# Patient Record
Sex: Female | Born: 1990 | Race: Black or African American | Hispanic: No | Marital: Single | Smoking: Never smoker
Health system: Southern US, Community
[De-identification: ages and names within clinical notes are randomized; demographics above are authoritative.]

## PROBLEM LIST (undated history)

## (undated) DIAGNOSIS — Z789 Other specified health status: Secondary | ICD-10-CM

## (undated) HISTORY — PX: NO PAST SURGERIES: SHX2092

---
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
## 2008-01-26 ENCOUNTER — Encounter: Payer: Self-pay | Admitting: Family Medicine

## 2008-02-02 ENCOUNTER — Ambulatory Visit: Payer: Self-pay | Admitting: Family Medicine

## 2008-02-02 ENCOUNTER — Encounter: Payer: Self-pay | Admitting: Family Medicine

## 2008-02-05 ENCOUNTER — Ambulatory Visit: Payer: Self-pay | Admitting: Family Medicine

## 2008-02-05 LAB — CONVERTED CEMR LAB
Chlamydia, DNA Probe: POSITIVE — AB
GC Probe Amp, Genital: NEGATIVE

## 2008-02-06 ENCOUNTER — Encounter: Payer: Self-pay | Admitting: Family Medicine

## 2008-05-02 ENCOUNTER — Emergency Department (HOSPITAL_COMMUNITY): Admission: EM | Admit: 2008-05-02 | Discharge: 2008-05-02 | Payer: Self-pay | Admitting: Emergency Medicine

## 2008-06-21 ENCOUNTER — Telehealth: Payer: Self-pay | Admitting: *Deleted

## 2008-06-21 ENCOUNTER — Emergency Department (HOSPITAL_COMMUNITY): Admission: EM | Admit: 2008-06-21 | Discharge: 2008-06-21 | Payer: Self-pay | Admitting: Emergency Medicine

## 2008-08-05 ENCOUNTER — Ambulatory Visit: Payer: Self-pay | Admitting: Family Medicine

## 2008-08-05 DIAGNOSIS — N912 Amenorrhea, unspecified: Secondary | ICD-10-CM | POA: Insufficient documentation

## 2008-08-12 ENCOUNTER — Telehealth: Payer: Self-pay | Admitting: *Deleted

## 2008-08-13 ENCOUNTER — Encounter (INDEPENDENT_AMBULATORY_CARE_PROVIDER_SITE_OTHER): Payer: Self-pay | Admitting: Family Medicine

## 2008-08-13 ENCOUNTER — Ambulatory Visit: Payer: Self-pay | Admitting: Family Medicine

## 2008-08-13 LAB — CONVERTED CEMR LAB
Blood in Urine, dipstick: NEGATIVE
Nitrite: NEGATIVE
Protein, U semiquant: 30

## 2008-08-14 ENCOUNTER — Encounter: Payer: Self-pay | Admitting: Family Medicine

## 2008-08-16 ENCOUNTER — Encounter: Payer: Self-pay | Admitting: Family Medicine

## 2008-08-17 LAB — CONVERTED CEMR LAB: Chlamydia, DNA Probe: POSITIVE — AB

## 2008-08-19 ENCOUNTER — Ambulatory Visit: Payer: Self-pay | Admitting: Family Medicine

## 2008-08-19 DIAGNOSIS — A5609 Other chlamydial infection of lower genitourinary tract: Secondary | ICD-10-CM

## 2008-08-19 LAB — CONVERTED CEMR LAB: Beta hcg, urine, semiquantitative: NEGATIVE

## 2008-12-15 ENCOUNTER — Encounter: Payer: Self-pay | Admitting: Family Medicine

## 2008-12-15 ENCOUNTER — Ambulatory Visit: Payer: Self-pay | Admitting: Family Medicine

## 2008-12-15 LAB — CONVERTED CEMR LAB: Whiff Test: NEGATIVE

## 2008-12-17 LAB — CONVERTED CEMR LAB
Chlamydia, DNA Probe: NEGATIVE
GC Probe Amp, Genital: NEGATIVE

## 2009-03-03 ENCOUNTER — Ambulatory Visit: Payer: Self-pay | Admitting: Family Medicine

## 2009-03-03 DIAGNOSIS — N644 Mastodynia: Secondary | ICD-10-CM

## 2009-04-13 ENCOUNTER — Telehealth: Payer: Self-pay | Admitting: Family Medicine

## 2009-04-14 ENCOUNTER — Ambulatory Visit: Payer: Self-pay | Admitting: Family Medicine

## 2009-04-14 DIAGNOSIS — L240 Irritant contact dermatitis due to detergents: Secondary | ICD-10-CM | POA: Insufficient documentation

## 2009-05-22 ENCOUNTER — Emergency Department (HOSPITAL_COMMUNITY): Admission: EM | Admit: 2009-05-22 | Discharge: 2009-05-22 | Payer: Self-pay | Admitting: Family Medicine

## 2009-12-16 ENCOUNTER — Ambulatory Visit: Payer: Self-pay | Admitting: Family Medicine

## 2009-12-16 ENCOUNTER — Encounter: Payer: Self-pay | Admitting: Family Medicine

## 2009-12-16 DIAGNOSIS — N760 Acute vaginitis: Secondary | ICD-10-CM | POA: Insufficient documentation

## 2009-12-16 LAB — CONVERTED CEMR LAB: Whiff Test: POSITIVE

## 2010-01-02 ENCOUNTER — Encounter: Payer: Self-pay | Admitting: *Deleted

## 2010-01-02 ENCOUNTER — Ambulatory Visit: Payer: Self-pay | Admitting: Family Medicine

## 2010-01-02 ENCOUNTER — Encounter: Payer: Self-pay | Admitting: Sports Medicine

## 2010-01-02 ENCOUNTER — Encounter: Payer: Self-pay | Admitting: Family Medicine

## 2010-03-01 ENCOUNTER — Ambulatory Visit: Admit: 2010-03-01 | Payer: Self-pay

## 2010-03-10 ENCOUNTER — Ambulatory Visit
Admission: RE | Admit: 2010-03-10 | Discharge: 2010-03-10 | Payer: Self-pay | Source: Home / Self Care | Attending: Family Medicine | Admitting: Family Medicine

## 2010-03-10 ENCOUNTER — Other Ambulatory Visit
Admission: RE | Admit: 2010-03-10 | Discharge: 2010-03-10 | Payer: Self-pay | Source: Home / Self Care | Admitting: Family Medicine

## 2010-03-10 ENCOUNTER — Other Ambulatory Visit: Payer: Self-pay | Admitting: Family Medicine

## 2010-03-10 ENCOUNTER — Encounter: Payer: Self-pay | Admitting: Family Medicine

## 2010-03-13 LAB — CONVERTED CEMR LAB
Chlamydia, DNA Probe: POSITIVE — AB
GC Probe Amp, Genital: POSITIVE — AB

## 2010-03-14 ENCOUNTER — Encounter: Payer: Self-pay | Admitting: Family Medicine

## 2010-03-14 ENCOUNTER — Ambulatory Visit
Admission: RE | Admit: 2010-03-14 | Discharge: 2010-03-14 | Payer: Self-pay | Source: Home / Self Care | Attending: Family Medicine | Admitting: Family Medicine

## 2010-03-14 DIAGNOSIS — A5400 Gonococcal infection of lower genitourinary tract, unspecified: Secondary | ICD-10-CM | POA: Insufficient documentation

## 2010-03-14 DIAGNOSIS — A5601 Chlamydial cystitis and urethritis: Secondary | ICD-10-CM | POA: Insufficient documentation

## 2010-03-14 LAB — CONVERTED CEMR LAB: HIV: NONREACTIVE

## 2010-03-28 NOTE — Assessment & Plan Note (Signed)
Summary: rash/[place]/chambliss   Vital Signs:  Patient profile:   20 year old female Height:      59 inches Weight:      163 pounds BMI:     33.04 Temp:     98.4 degrees F oral Pulse rate:   83 / minute BP sitting:   112 / 74  (right arm) Cuff size:   regular  Vitals Entered By: Garen Grams LPN (April 14, 2009 8:43 AM) CC: rash on arms and back Is Patient Diabetic? No Pain Assessment Patient in pain? no        Primary Care Provider:  Pearlean Brownie MD  CC:  rash on arms and back.  History of Present Illness: 20 yo here for 1-2 day history of rash on arms and back.  Notes itchiness with a lot of scratching in upper back and bilateral arms.  No other hosuehold contacts have similar.  She says she recently changed soaps to [place] right before this.  No history of eczema or allergies.  Not takign any medications.  itching keeping her up at night.  Habits & Providers  Alcohol-Tobacco-Diet     Tobacco Status: never  Allergies: No Known Drug Allergies PMH-FH-SH reviewed-no changes except otherwise noted  Review of Systems      See HPI General:  Denies fever. Derm:  Complains of changes in color of skin, itching, and rash; denies changes in nail beds, dryness, and insect bite(s).  Physical Exam  General:  Well-developed,well-nourished,in no acute distress; alert,appropriate and cooperative throughout examination Skin:  no rash noted on upper back.  3 scattered erythematous papules on left forearm, 4 scattered papules under right forearm.  No lesions noted on fingers, abd, ankles, legs.   Impression & Recommendations:  Problem # 1:  CONTACT DERMATITIS&OTHER ECZEMA DUE DETERGENTS (ICD-692.0)  Likely irritant dermatitis due to new detergent in setting of winter itch.  Will prescribed non-sedatign antihistamine and triamcinolone for lesions.  Given list of gentle soaps to change to, moisturization, and to consider changing to non-scented detergent and washing sheets.   Discussed possibility of insect bites but does not look like scabies or fleas.  Patient to return if no improvement or new lesions.  Her updated medication list for this problem includes:    Cetirizine Hcl 10 Mg Tabs (Cetirizine hcl) .Marland Kitchen... Take one tablet daily for itching    Triamcinolone Acetonide 0.1 % Crea (Triamcinolone acetonide) .Marland Kitchen... Apply to affected areas twice daily. dispense 80 gm tube  Orders: [facility]- Est Level  3 (13086)  Complete Medication List: 1)  Sprintec 28 0.25-35 Mg-mcg Tabs (Norgestimate-eth estradiol) .Marland Kitchen.. 1 daily same time of day 2)  Cetirizine Hcl 10 Mg Tabs (Cetirizine hcl) .... Take one tablet daily for itching 3)  Triamcinolone Acetonide 0.1 % Crea (Triamcinolone acetonide) .... Apply to affected areas twice daily. dispense 80 gm tube  Patient Instructions: 1)  Try gentle soaps like Dove, Aveena, Purpose, Cetaphil.  Avoid heaviuly scented soaps. 2)  Moisturize after showers and twice daily.  Some good moisturizer are Aveeno, Eucerin. 3)  Apply steroid cream on body only as long as needed.   4)  Take antihistamine for itching. 5)  If you notice new bumps in areas where you dont scratch or others in your family are getting these, or you don't get better, Please return. Prescriptions: TRIAMCINOLONE ACETONIDE 0.1 % CREA (TRIAMCINOLONE ACETONIDE) apply to affected areas twice daily. Dispense 80 gm tube  #1 x 0   Entered and Authorized by:  Delbert Harness MD   Signed by:   Delbert Harness MD on 04/14/2009   Method used:   Electronically to        [facility] [phone]* (retail)       [address]       [place], [place]  [address]       Ph: [phone]       Fax: [phone]   RxID:   [id number] CETIRIZINE HCL 10 MG TABS (CETIRIZINE HCL) take one tablet daily for itching  #30 x 1   Entered and Authorized by:   Delbert Harness MD   Signed by:   Delbert Harness MD on 04/14/2009   Method used:   Electronically to        [facility] [phone]* (retail)        [address]       [place], [place]  [address]       Ph: [phone]       Fax: [phone]   RxID:   [id number]

## 2010-03-28 NOTE — Letter (Signed)
Summary: Handout Printed  Printed Handout:  - Abscess-Boil, Care After Surgery

## 2010-03-28 NOTE — Progress Notes (Signed)
Summary: triage  Phone Note Call from Patient Call back at [phone]   Caller: Patient Summary of Call: Pt has little bumps on her arms and back and they itch.  Wondering if she can be worked in first thing in the morning. Initial call taken by: Clydell Hakim,  April 13, 2009 2:01 PM  Follow-up for Phone Call        started yesterday. her sister told her it may be bedbugs. it is a fine rash all over. told her what bedbugs look like & how to look for them. unable to come iin today. appt at 8:30am Thursday Follow-up by: Golden Circle RN,  April 13, 2009 2:20 PM

## 2010-03-28 NOTE — Miscellaneous (Signed)
Summary: Procedure Consent  Procedure Consent   Imported By: De Nurse 01/05/2010 11:49:39  _____________________________________________________________________  External Attachment:    Type:   Image     Comment:   External Document

## 2010-03-28 NOTE — Assessment & Plan Note (Signed)
Summary: breast pain,df   Vital Signs:  Patient profile:   20 year old female Height:      59 inches Weight:      159 pounds BMI:     32.23 BSA:     1.67 Temp:     98.5 degrees F Pulse rate:   91 / minute BP sitting:   139 / 82  Vitals Entered By: Jone Baseman CMA (March 03, 2009 8:39 AM) CC: bilateral breast pain Is Patient Diabetic? No Pain Assessment Patient in pain? yes     Location: breast   Primary Care Provider:  Pearlean Brownie MD  CC:  bilateral breast pain.  History of Present Illness: Breast tenderness for last 1.5 months associated with nausea and mild headaches.  Pain is diffuse in both breasts with focal tenderness or redness or discharge.  LMP was light in December.  Has not had regular menstrual periods since started depo.  Is sex active does not desire pregnancy.  No vaginal symptoms  ROS - as above PMH - Medications reviewed and updated in medication list.  Smoking Status noted in VS form    Habits & Providers  Alcohol-Tobacco-Diet     Tobacco Status: never  Allergies: No Known Drug Allergies  Physical Exam  Breasts:  No mass, nodules, thickening, s, bulging, retraction, inflamation, nipple discharge or skin changes noted.  is mildly diffusely tender  Abdomen:  Bowel sounds positive,abdomen soft and non-tender without masses, organomegaly or hernias noted.   Impression & Recommendations:  Problem # 1:  BREAST PAIN (ICD-611.71)  Upreg is negative so unlikely due to pregnancy but will recheck in 1 week.  Likely due to hormone fluctuations as comes off of depo.   Recommend analgesics and will start bcp if next upreg is normal   Orders: [facility]- Est Level  3 ([address])  Problem # 2:  CONTRACEPTIVE MANAGEMENT (ICD-V25.09) start bcp as above.  Has taken before without problems  Complete Medication List: 1)  Sprintec 28 0.25-35 Mg-mcg Tabs (Norgestimate-eth estradiol) .Marland Kitchen.. 1 daily same time of day  Other Orders: U Preg-[facility]  (60454)  Patient Instructions: 1)  come back in 1 week for a pregnancy test - Nurse Visit 2)  If the test is negative start the birth control pills that day - one each day 3)  Come back in 1 month for a Pap Smear 4)  Use Tylenol or ibuprofen for pain Prescriptions: SPRINTEC 28 0.25-35 MG-MCG TABS (NORGESTIMATE-ETH ESTRADIOL) 1 daily same time of day  #1 x 11   Entered and Authorized by:   Pearlean Brownie MD   Signed by:   Pearlean Brownie MD on 03/03/2009   Method used:   Electronically to        [facility] [phone]* (retail)       [address]       [place], [place]  [address]       Ph: [phone]       Fax: [phone]   RxID:   [id number]   Laboratory Results   Urine Tests  Date/Time Received: March 03, 2009 8:43 AM  Date/Time Reported: March 03, 2009 8:49 AM     Urine HCG: negative Comments: ............................................... Delora Fuel March 03, 2009 8:49 AM      Prevention & Chronic Care Immunizations   Influenza vaccine: Not documented    Tetanus booster: 10/01/2006: Tdap [employer])   Tetanus booster due: 09/30/2016    Pneumococcal vaccine: Not documented  Other Screening  Pap smear: LOW GRADE SQUAMOUS INTRAEPITHELIAL LESION: CIN-1/ VAIN-1/  (02/02/2008)   Pap smear due: 02/01/2009   Smoking status: never  (03/03/2009)

## 2010-03-28 NOTE — Assessment & Plan Note (Signed)
Summary: knot under arm,df   Vital Signs:  Patient profile:   20 year old female Weight:      152 pounds Temp:     98.7 degrees F oral Pulse rate:   106 / minute Pulse rhythm:   regular BP sitting:   137 / 83  (left arm) Cuff size:   regular  Vitals Entered By: Loralee Pacas CMA (January 02, 2010 11:26 AM) CC: knot under right arm Comments knot under right arm x 3 days really painful   Primary Care Provider:  Pearlean Brownie MD  CC:  knot under right arm.  History of Present Illness: 20 yo female here with a few days of painful swelling under R axilla.  Swelling:  No drainage however there is a head to the swelling per pt.  Worsening.  No fevers/chills.  o numbness/tingling in arm or hand.  Hurts to move arm.  Hasn't tried any medicine for this.  This has never happened before.    Habits & Providers  Alcohol-Tobacco-Diet     Tobacco Status: never  Exercise-Depression-Behavior     Have you felt down or hopeless? no     Have you felt little pleasure in things? no     Depression Counseling: not indicated; screening negative for depression     Seat Belt Use: always  Current Medications (verified): 1)  Sprintec 28 0.25-35 Mg-Mcg Tabs (Norgestimate-Eth Estradiol) .Marland Kitchen.. 1 Daily Same Time of Day 2)  Cetirizine Hcl 10 Mg Tabs (Cetirizine Hcl) .... Take One Tablet Daily For Itching 3)  Triamcinolone Acetonide 0.1 % Crea (Triamcinolone Acetonide) .... Apply To Affected Areas Twice Daily. Dispense 80 Gm Tube 4)  Naproxen Sodium 550 Mg Tabs (Naproxen Sodium) .... One Tab Po Two Times A Day As Needed For Pain.  Allergies (verified): No Known Drug Allergies  Social History: Risk analyst Use:  always  Review of Systems       See HPI  Physical Exam  General:  Well-developed,well-nourished,in no acute distress; alert,appropriate and cooperative throughout examination Extremities:  2cm boil without drainage and without surrounding induration.  Fluctuant, warm, very  tender. Additional Exam:  Procedure: I&D Consent obtained and verified. Sterile betadine prep. Furthur cleansed with alcohol. 6 cc of 2% lidocaine with epi infiltrated around and under abscess. Draped in a sterile fashion. Confirmed local anesthesia, then 2cm incision made with #11 blade Copious amounts of foul smelling pus burst out. 4 quadrants explored with curved hemostat and loculations broken up. Approx 8-9 inches of 1/4 inch iodoform placed in with 2cm tail sticking out. Completed without difficulty Aftercare instructions and Red flags advised.      Impression & Recommendations:  Problem # 1:  ABSCESS, AXILLA, RIGHT (ICD-682.3) Assessment New I&D'ed. Naproxen 550 two times a day for pain. Pt to remove half the packing tomorrow and the rest 1-2d after that. RTC as needed.  Handout given.  Orders: [facility]- Est Level  3 ([address]) I&D Abcess, simple- [facility] (10060)  Complete Medication List: 1)  Sprintec 28 0.25-35 Mg-mcg Tabs (Norgestimate-eth estradiol) .Marland Kitchen.. 1 daily same time of day 2)  Cetirizine Hcl 10 Mg Tabs (Cetirizine hcl) .... Take one tablet daily for itching 3)  Triamcinolone Acetonide 0.1 % Crea (Triamcinolone acetonide) .... Apply to affected areas twice daily. dispense 80 gm tube 4)  Naproxen Sodium 550 Mg Tabs (Naproxen sodium) .... One tab po two times a day as needed for pain. Prescriptions: NAPROXEN SODIUM 550 MG TABS (NAPROXEN SODIUM) One tab Po two times a day  as needed for pain.  #30 x 0   Entered and Authorized by:   Rodney Langton MD   Signed by:   Rodney Langton MD on 01/02/2010   Method used:   Print then Give to Patient   RxID:   [id number]    Orders Added: 1)  [facility]- Est Level  3 [14782] 2)  I&D Abcess, simple- [facility] [10060]

## 2010-03-28 NOTE — Assessment & Plan Note (Signed)
Summary: BV infection   Vital Signs:  Patient profile:   20 year old female Weight:      154 pounds Temp:     98.4 degrees F oral Pulse rate:   98 / minute Pulse rhythm:   regular BP sitting:   113 / 72  (right arm) Cuff size:   regular  Vitals Entered By: Loralee Pacas CMA (December 16, 2009 10:18 AM) CC: ? yeast infection   Primary Care Kasyn Rolph:  Pearlean Brownie MD  CC:  ? yeast infection.  History of Present Illness: Vaginal Discharge: Pt comes in with vaginal discharge. SHe has not had any new sexual partners, but she does have some itching. No increased frequency, no burning, no new odor. She has had lots os yeast infections in the past and she wants to have some anti-yeast meds.   Habits & Providers  Alcohol-Tobacco-Diet     Tobacco Status: never  Current Medications (verified): 1)  Sprintec 28 0.25-35 Mg-Mcg Tabs (Norgestimate-Eth Estradiol) .Marland Kitchen.. 1 Daily Same Time of Day 2)  Cetirizine Hcl 10 Mg Tabs (Cetirizine Hcl) .... Take One Tablet Daily For Itching 3)  Triamcinolone Acetonide 0.1 % Crea (Triamcinolone Acetonide) .... Apply To Affected Areas Twice Daily. Dispense 80 Gm Tube 4)  Metronidazole 500 Mg Tabs (Metronidazole) .... Take 1 By Mouth Two Times A Day X 7 Days  Allergies (verified): No Known Drug Allergies  Review of Systems       see HPI  Physical Exam  General:  Well-developed,well-nourished,in no acute distress; alert,appropriate and cooperative throughout examination Genitalia:  Normal introitus for age, no external lesions, + white vaginal discharge, mucosa pink and moist, no vaginal or cervical lesions, no vaginal atrophy, no friaility or hemorrhage, normal uterus size and position, no adnexal masses or tenderness   Impression & Recommendations:  Problem # 1:  BACTERIAL VAGINITIS (ICD-616.10) Assessment New Pt had wet prep and bimanual exam in the clinic. She does not have any motion tenderness. She was found to have BV. Will treat ast  below.   Her updated medication list for this problem includes:    Metronidazole 500 Mg Tabs (Metronidazole) .Marland Kitchen... Take 1 by mouth two times a day x 7 days  Orders: [facility]- Est Level  3 ([address])  Complete Medication List: 1)  Sprintec 28 0.25-35 Mg-mcg Tabs (Norgestimate-eth estradiol) .Marland Kitchen.. 1 daily same time of day 2)  Cetirizine Hcl 10 Mg Tabs (Cetirizine hcl) .... Take one tablet daily for itching 3)  Triamcinolone Acetonide 0.1 % Crea (Triamcinolone acetonide) .... Apply to affected areas twice daily. dispense 80 gm tube 4)  Metronidazole 500 Mg Tabs (Metronidazole) .... Take 1 by mouth two times a day x 7 days  Other Orders: Wet Prep- [facility] [id number]) GC/Chlamydia-[facility] (87591/87491)  Patient Instructions: 1)  You have bacterial vaginosis. No yeast was seen.  2)  Take the medicine as prescribed.  Prescriptions: METRONIDAZOLE 500 MG TABS (METRONIDAZOLE) take 1 by mouth two times a day x 7 days  #14 x 0   Entered and Authorized by:   Jamie Brookes MD   Signed by:   Jamie Brookes MD on 12/16/2009   Method used:   Electronically to        [facility] (retail)       [address]       [place], [place]  [id number]       Ph: [phone]       Fax: [phone]   RxID:   [id number]  Orders Added: 1)  Wet Prep- [facility] [87210] 2)  GC/Chlamydia-[facility] [87591/87491] 3)  [facility]- Est Level  3 [[address]]    Laboratory Results  Date/Time Received: December 16, 2009 11:17 AM  Date/Time Reported: December 16, 2009 11:29 AM   Wet [place] Source: vag WBC/hpf: 5-15 Bacteria/hpf: 3+  Cocci Clue cells/hpf: many  Positive whiff Yeast/hpf: none Trichomonas/hpf: none Comments: ...............test performed by......Marland KitchenBonnie A. [place]az[place], MLS (ASCP)cm

## 2010-03-28 NOTE — Letter (Signed)
Summary: Out of Work  [facility]  [address]   [place], [place] [address]   Phone: [phone]  Fax: [phone]    January 02, 2010   Employee:  Dondra Prader TAHISHA HAKIM    To Whom It May Concern:   For Medical reasons, please excuse the above named employee from work for the following dates:  Start:   January 02, 2010  End:  January 03, 2010  If you need additional information, please feel free to contact our office.         Sincerely,    Loralee Pacas CMA

## 2010-03-30 ENCOUNTER — Encounter: Payer: Self-pay | Admitting: *Deleted

## 2010-03-30 NOTE — Assessment & Plan Note (Signed)
Summary: std tx,df  Nurse Visit   Allergies: No Known Drug Allergies  Medication Administration  Injection # 1:    Medication: Rocephin  250mg     Diagnosis: GONORRHEA (ICD-098.0)    Route: IM    Site: RUOQ gluteus    Exp Date: 09/26/2012    Lot #: [id number]    Mfr: Hospira    Comments: patient waited in office 20 minutes after injection without any problems    Patient tolerated injection without complications    Given by: Theresia Lo RN (March 14, 2010 10:23 AM)  Medication # 1:    Medication: Azithromycin oral    Diagnosis: CHLAMYDIAL INFECTION (ICD-099.41)    Dose:  1 gram    Route: po    Exp Date: 05/28/2011    Lot #: [id number]    Mfr: Pfizer    Patient tolerated medication without complications    Given by: Theresia Lo RN (March 14, 2010 10:24 AM)  Orders Added: 1)  Rocephin  250mg  [J0696] 2)  Azithromycin oral [Q0144] 3)  Admin of Injection (IM/SQ) [11914]   Medication Administration  Injection # 1:    Medication: Rocephin  250mg     Diagnosis: GONORRHEA (ICD-098.0)    Route: IM    Site: RUOQ gluteus    Exp Date: 09/26/2012    Lot #: [id number]    Mfr: Hospira    Comments: patient waited in office 20 minutes after injection without any problems    Patient tolerated injection without complications    Given by: Theresia Lo RN (March 14, 2010 10:23 AM)  Medication # 1:    Medication: Azithromycin oral    Diagnosis: CHLAMYDIAL INFECTION (ICD-099.41)    Dose:  1 gram    Route: po    Exp Date: 05/28/2011    Lot #: [id number]    Mfr: Pfizer    Patient tolerated medication without complications    Given by: Theresia Lo RN (March 14, 2010 10:24 AM)  Orders Added: 1)  Rocephin  250mg  [J0696] 2)  Azithromycin oral [Q0144] 3)  Admin of Injection (IM/SQ) [78469]    Appended Document: std tx,df Communicable Disease report faxed to [facility]  Department.

## 2010-03-30 NOTE — Letter (Signed)
Summary: Results Follow-up Letter  [facility]  [address]   [place], [place] [address]   Phone: [phone]  Fax: [phone]    03/14/2010  [address] [place], [place]  [address]  Dear Ms. Soffer,   The following are the results of your recent test(s):  Your tests for HIV and syphillis are negative.  Sincerely,  Delbert Harness MD [facility]           Appended Document: Results Follow-up Letter mailed

## 2010-03-30 NOTE — Assessment & Plan Note (Signed)
Summary: vag d/c/bmc   ****NEXT DEPO DUE 3/31-4/14****  Vital Signs:  Patient profile:   20 year old female Height:      59 inches Weight:      147 pounds Temp:     97.9 degrees F oral Pulse rate:   72 / minute Pulse rhythm:   regular BP sitting:   120 / 73  (left arm) Cuff size:   regular  Vitals Entered By: Loralee Pacas CMA (March 10, 2010 3:35 PM) CC: vaginal d/c Is Patient Diabetic? No Pain Assessment Patient in pain? no        Primary Care Provider:  Pearlean Brownie MD  CC:  vaginal d/c.  History of Present Illness: 20 yo here for evaluation of vaginal discharge  VAGINAL DISCHARGE Onset: days Description:  white with odor Modifying factors: none  Symptoms Odor: yes Itching: no Vaginal burning: no  Dysuria: no Bleeding: no Pelvic pain: no Fever: no Genital sores: no Dyspareunia: no   Red Flags:  Missed period: no Possible STD exposure: uses condoms       Habits & Providers  Alcohol-Tobacco-Diet     Tobacco Status: never  Exercise-Depression-Behavior     Have you felt down or hopeless? no     Have you felt little pleasure in things? no     Depression Counseling: not indicated; screening negative for depression     Seat Belt Use: always  Current Medications (verified): 1)  Sprintec 28 0.25-35 Mg-Mcg Tabs (Norgestimate-Eth Estradiol) .Marland Kitchen.. 1 Daily Same Time of Day 2)  Cetirizine Hcl 10 Mg Tabs (Cetirizine Hcl) .... Take One Tablet Daily For Itching 3)  Triamcinolone Acetonide 0.1 % Crea (Triamcinolone Acetonide) .... Apply To Affected Areas Twice Daily. Dispense 80 Gm Tube 4)  Naproxen Sodium 550 Mg Tabs (Naproxen Sodium) .... One Tab Po Two Times A Day As Needed For Pain. 5)  Metrogel-Vaginal 0.75 % Gel (Metronidazole) .... Apply 5 Gm Vaginallynightly X 5 Days 6)  Metronidazole 500 Mg Tabs (Metronidazole) .... One Tablet Twice A Day For 7 Days  Allergies: No Known Drug Allergies PMH-FH-SH reviewed for relevance  Review of  Systems      See HPI  Physical Exam  General:  Well-developed,well-nourished,in no acute distress; alert,appropriate and cooperative throughout examination Genitalia:  Pelvic Exam:        External: normal female genitalia without lesions or masses        Vagina: normal without lesions or masses        Cervix: normal without lesions or masses        Adnexa: normal bimanual exam without masses or fullness        Uterus: normal by palpation        Pap smear: performed   Impression & Recommendations:  Problem # 1:  BACTERIAL VAGINITIS (ICD-616.10) Will terat BV, gonorrhea, chlamydia pending.  Patient would prefer metrogel but unsure if she can afford it as she is self-pay.  I gave her prescriptions for both metrogel and metronidazole tablets and she will fill one of those.  Her updated medication list for this problem includes:    Metrogel-vaginal 0.75 % Gel (Metronidazole) .Marland Kitchen... Apply 5 gm vaginallynightly x 5 days    Metronidazole 500 Mg Tabs (Metronidazole) ..... One tablet twice a day for 7 days  Orders: GC/Chlamydia-[facility] (87591/87491) Wet Prep- [facility] (98119) [facility]- Est Level  3 (14782)  Problem # 2:  CONTRACEPTIVE MANAGEMENT (ICD-V25.09) discussed contraception. She would like depo.  pap performed today.  Orders: U Preg-[facility] (  13086) Depo-Provera 150mg  (J1055) [facility]- Est Level  3 (57846)  Complete Medication List: 1)  Cetirizine Hcl 10 Mg Tabs (Cetirizine hcl) .... Take one tablet daily for itching 2)  Triamcinolone Acetonide 0.1 % Crea (Triamcinolone acetonide) .... Apply to affected areas twice daily. dispense 80 gm tube 3)  Naproxen Sodium 550 Mg Tabs (Naproxen sodium) .... One tab po two times a day as needed for pain. 4)  Metrogel-vaginal 0.75 % Gel (Metronidazole) .... Apply 5 gm vaginallynightly x 5 days 5)  Metronidazole 500 Mg Tabs (Metronidazole) .... One tablet twice a day for 7 days 6)  Depo-provera 150 Mg/ml Susp (Medroxyprogesterone acetate) .... Once every 12  weeks  Other Orders: Pap Smear-[facility] ([id number]) Prescriptions: METRONIDAZOLE 500 MG TABS (METRONIDAZOLE) one tablet twice a day for 7 days  #14 x 0   Entered and Authorized by:   Delbert Harness MD   Signed by:   Delbert Harness MD on 03/10/2010   Method used:   Print then Give to Patient   RxID:   [id number] METROGEL-VAGINAL 0.75 % GEL (METRONIDAZOLE) Apply 5 gm vaginallynightly x 5 days  #1 x 0   Entered and Authorized by:   Delbert Harness MD   Signed by:   Delbert Harness MD on 03/10/2010   Method used:   Print then Give to Patient   RxID:   [id number]    Medication Administration  Injection # 1:    Medication: Depo-Provera 150mg     Diagnosis: CONTRACEPTIVE MANAGEMENT (ICD-V25.09)    Route: IM    Site: R deltoid    Exp Date: 06/2012    Lot #: [id number]    Mfr: GREENSTONE    Patient tolerated injection without complications    Given by: Jimmy Footman, CMA (March 10, 2010 5:50 PM)  Orders Added: 1)  Pap Smear-[facility] [[id number]] 2)  U Preg-[facility] [81025] 3)  GC/Chlamydia-[facility] [87591/87491] 4)  Wet Prep- [facility] [87210] 5)  Depo-Provera 150mg  [J1055] 6)  [facility]- Est Level  3 [99213]    Laboratory Results   Urine Tests  Date/Time Received: March 10, 2010 4:03 PM  Date/Time Reported: March 10, 2010 4:16 PM     Urine HCG: negative Comments: ...............test performed by......Marland KitchenBonnie A. [place], MLS (ASCP)cm  Date/Time Received: March 10, 2010 3:42 PM  Date/Time Reported: March 10, 2010 4:21 PM   [employer] Source: vag WBC/hpf: 1-5 Bacteria/hpf: 3+  Cocci Clue cells/hpf: many  Positive whiff Yeast/hpf: none Trichomonas/hpf: none Comments: ...............test performed by......Marland KitchenBonnie A. [place], MLS (ASCP)cm

## 2010-05-21 LAB — POCT URINALYSIS DIP (DEVICE)
Glucose, UA: NEGATIVE mg/dL
Nitrite: NEGATIVE
Urobilinogen, UA: >=8 mg/dL (ref 0.0–1.0)

## 2010-05-21 LAB — WET PREP, GENITAL: Yeast Wet Prep HPF POC: NONE SEEN

## 2010-05-21 LAB — GC/CHLAMYDIA PROBE AMP, GENITAL: GC Probe Amp, Genital: NEGATIVE

## 2010-05-21 LAB — POCT PREGNANCY, URINE: Preg Test, Ur: NEGATIVE

## 2010-05-31 ENCOUNTER — Encounter: Payer: Self-pay | Admitting: Family Medicine

## 2010-06-07 LAB — WET PREP, GENITAL

## 2010-06-07 LAB — POCT URINALYSIS DIP (DEVICE)
Bilirubin Urine: NEGATIVE
Glucose, UA: NEGATIVE mg/dL
Ketones, ur: NEGATIVE mg/dL
Nitrite: NEGATIVE
Specific Gravity, Urine: 1.02 (ref 1.005–1.030)

## 2010-06-07 LAB — GC/CHLAMYDIA PROBE AMP, GENITAL: Chlamydia, DNA Probe: NEGATIVE

## 2010-06-08 LAB — DIFFERENTIAL
Eosinophils Relative: 3 % (ref 0–5)
Lymphocytes Relative: 33 % (ref 12–46)
Lymphs Abs: 1.7 10*3/uL (ref 0.7–4.0)
Monocytes Absolute: 0.4 10*3/uL (ref 0.1–1.0)
Monocytes Relative: 7 % (ref 3–12)

## 2010-06-08 LAB — URINALYSIS, ROUTINE W REFLEX MICROSCOPIC
Bilirubin Urine: NEGATIVE
Glucose, UA: NEGATIVE mg/dL
Hgb urine dipstick: NEGATIVE
Ketones, ur: NEGATIVE mg/dL
Nitrite: NEGATIVE
Protein, ur: NEGATIVE mg/dL
Specific Gravity, Urine: 1.023 (ref 1.005–1.030)
Urobilinogen, UA: 1 mg/dL (ref 0.0–1.0)
pH: 6.5 (ref 5.0–8.0)

## 2010-06-08 LAB — POCT I-STAT, CHEM 8
Hemoglobin: 15 g/dL (ref 12.0–15.0)
Sodium: 140 mEq/L (ref 135–145)
TCO2: 23 mmol/L (ref 0–100)

## 2010-06-08 LAB — POCT PREGNANCY, URINE: Preg Test, Ur: NEGATIVE

## 2010-06-08 LAB — CBC
HCT: 40.4 % (ref 36.0–46.0)
Hemoglobin: 14 g/dL (ref 12.0–15.0)
RDW: 12.8 % (ref 11.5–15.5)
WBC: 5 10*3/uL (ref 4.0–10.5)

## 2010-06-08 LAB — RPR: RPR Ser Ql: NONREACTIVE

## 2010-06-08 LAB — WET PREP, GENITAL: WBC, Wet Prep HPF POC: NONE SEEN

## 2010-06-08 LAB — GC/CHLAMYDIA PROBE AMP, GENITAL: GC Probe Amp, Genital: NEGATIVE

## 2010-06-14 ENCOUNTER — Encounter: Payer: Self-pay | Admitting: Family Medicine

## 2010-06-14 ENCOUNTER — Ambulatory Visit (INDEPENDENT_AMBULATORY_CARE_PROVIDER_SITE_OTHER): Payer: Self-pay | Admitting: Family Medicine

## 2010-06-14 DIAGNOSIS — Z309 Encounter for contraceptive management, unspecified: Secondary | ICD-10-CM

## 2010-06-14 LAB — POCT URINE PREGNANCY: Preg Test, Ur: NEGATIVE

## 2010-06-14 MED ORDER — CETIRIZINE HCL 10 MG PO TABS: 10.0000 mg | ORAL_TABLET | Freq: Every day | ORAL | Status: DC

## 2010-06-14 MED ORDER — MEDROXYPROGESTERONE ACETATE 150 MG/ML IM SUSP
150.0000 mg | Freq: Once | INTRAMUSCULAR | Status: AC
  Administered 2010-06-14: 150 mg via INTRAMUSCULAR

## 2010-06-14 NOTE — Progress Notes (Signed)
  Subjective:     Jill Rush is a 20 y.o. female and is here for a comprehensive physical exam. The patient reports no problems.  History   Social History  . Marital Status: Single    Spouse Name: N/A    Number of Children: N/A  . Years of Education: N/A   Occupational History  . Not on file.   Social History Main Topics  . Smoking status: Never Smoker   . Smokeless tobacco: Never Used  . Alcohol Use: 0.5 oz/week    1 drink(s) per week  . Drug Use: No  . Sexually Active: Yes -- Female partner(s)   Other Topics Concern  . 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 Tetanus/tdap  02/19/2008  . Pap Smear  03/10/2013      Review of Systems Patient reports no vision/ hearing  changes, adenopathy,fever, weight change,  persistant / recurrent hoarseness , swallowing issues, chest pain,palpitations,edema,persistant /recurrent cough, hemoptysis, dyspnea( rest/ exertional/paroxysmal nocturnal), gastrointestinal bleeding(melena, rectal bleeding), abdominal pain, significant heartburn boel changes,GU symptoms(dysuria, hematuria,pyuria, incontinence) ), Gyn symptoms(abnormal  bleeding , pain),  syncope, focal weakness, memory loss,numbness & tingling, skin/hair /nail changes,abnormal bruising or bleeding, anxiety,or depression.   Objective:   Neck:  No deformities, thyromegaly, masses, or tenderness noted.   Supple with full range of motion without pain. Lungs:  Normal respiratory effort, chest expands symmetrically. Lungs are clear to auscultation, no crackles or wheezes. Heart - Regular rate and rhythm.  No murmurs, gallops or rubs.    Extremities:  No cyanosis, edema, or deformity noted with good range of motion of all major joints.   Mouth - no lesions, mucous membranes are moist, no decaying teeth  Abdomen: soft and non-tender without masses, organomegaly or hernias noted.  No guarding or  rebound    Assessment:    Healthy female exam.   Plan:     See After Visit Summary for Counseling Recommendations

## 2010-06-14 NOTE — Patient Instructions (Signed)
Take the Zyrtec as needed for allergies You should come back in 1 year for a pap or sooner is any symptoms Regular exercise will keep you healthy

## 2010-08-03 ENCOUNTER — Inpatient Hospital Stay (INDEPENDENT_AMBULATORY_CARE_PROVIDER_SITE_OTHER)
Admission: RE | Admit: 2010-08-03 | Discharge: 2010-08-03 | Disposition: A | Discharge status: Home / Self Care | Payer: Self-pay | Source: Ambulatory Visit | Attending: Emergency Medicine | Admitting: Emergency Medicine

## 2010-08-03 DIAGNOSIS — N760 Acute vaginitis: Secondary | ICD-10-CM

## 2010-08-03 LAB — POCT URINALYSIS DIP (DEVICE)
Nitrite: NEGATIVE
Protein, ur: 30 mg/dL — AB
pH: 6.5 (ref 5.0–8.0)

## 2010-08-04 LAB — GC/CHLAMYDIA PROBE AMP, GENITAL: Chlamydia, DNA Probe: NEGATIVE

## 2010-08-15 ENCOUNTER — Inpatient Hospital Stay (INDEPENDENT_AMBULATORY_CARE_PROVIDER_SITE_OTHER)
Admission: RE | Admit: 2010-08-15 | Discharge: 2010-08-15 | Disposition: A | Discharge status: Home / Self Care | Payer: Self-pay | Source: Ambulatory Visit | Attending: Emergency Medicine | Admitting: Emergency Medicine

## 2010-08-15 DIAGNOSIS — B373 Candidiasis of vulva and vagina: Secondary | ICD-10-CM

## 2010-08-15 LAB — POCT URINALYSIS DIP (DEVICE)
Glucose, UA: 100 mg/dL — AB
Ketones, ur: NEGATIVE mg/dL
Specific Gravity, Urine: 1.025 (ref 1.005–1.030)
Urobilinogen, UA: 2 mg/dL — ABNORMAL HIGH (ref 0.0–1.0)

## 2010-08-15 LAB — WET PREP, GENITAL: Trich, Wet Prep: NONE SEEN

## 2010-08-15 LAB — POCT PREGNANCY, URINE: Preg Test, Ur: NEGATIVE

## 2010-08-16 ENCOUNTER — Ambulatory Visit: Payer: Self-pay | Admitting: Family Medicine

## 2010-10-23 ENCOUNTER — Other Ambulatory Visit: Payer: Self-pay | Admitting: Family Medicine

## 2010-10-23 ENCOUNTER — Encounter: Payer: Self-pay | Admitting: Family Medicine

## 2010-10-23 ENCOUNTER — Telehealth: Payer: Self-pay | Admitting: Family Medicine

## 2010-10-23 ENCOUNTER — Ambulatory Visit (INDEPENDENT_AMBULATORY_CARE_PROVIDER_SITE_OTHER): Payer: Self-pay | Admitting: Family Medicine

## 2010-10-23 VITALS — BP 113/73 | HR 73 | Temp 97.9°F | Wt 147.0 lb

## 2010-10-23 DIAGNOSIS — N912 Amenorrhea, unspecified: Secondary | ICD-10-CM

## 2010-10-23 DIAGNOSIS — N760 Acute vaginitis: Secondary | ICD-10-CM

## 2010-10-23 DIAGNOSIS — Z202 Contact with and (suspected) exposure to infections with a predominantly sexual mode of transmission: Secondary | ICD-10-CM

## 2010-10-23 DIAGNOSIS — Z7189 Other specified counseling: Secondary | ICD-10-CM

## 2010-10-23 LAB — POCT WET PREP (WET MOUNT)
Trichomonas Wet Prep HPF POC: NEGATIVE
Yeast Wet Prep HPF POC: NEGATIVE

## 2010-10-23 LAB — RPR

## 2010-10-23 MED ORDER — METRONIDAZOLE 500 MG PO TABS: 500.0000 mg | ORAL_TABLET | Freq: Two times a day (BID) | ORAL | Status: AC

## 2010-10-23 NOTE — Assessment & Plan Note (Signed)
+   vaginal discharge.  GC/Chlam obtained.  Also tested pt for HIV and RPR.  Will call pt with results and treatment plan.

## 2010-10-23 NOTE — Patient Instructions (Signed)
I will call you with the results if you need to take any medication.  If the results are normal I will send you a letter.  Remember to take a prenatal vitamin daily.

## 2010-10-23 NOTE — Telephone Encounter (Signed)
LMOVM informing her that the results would not be back until tomorrow Fleeger, Maryjo Rochester

## 2010-10-23 NOTE — Telephone Encounter (Signed)
Is calling for lab results.

## 2010-10-23 NOTE — Progress Notes (Signed)
  Subjective:    Patient ID: Jill Rush, female    DOB: 26-Jul-1990, 20 y.o.   MRN: [id number]  HPI Vaginal discharge: White discharge x 5 days. No foul odor. No itching.  No burning.  Has 1 sexual partner.  Doesn't use protection b/c trying to get pregnant.  Depo was due in July and decided that she wants to try to conceive so did not get shot.  States that she and her partner a monogamous.  But consents to STD testing, "to be sure".  No fever.  No abdominal pain.   Review of Systems As per above    Objective:   Physical Exam  Constitutional: She is oriented to person, place, and time. She appears well-developed and well-nourished.  Cardiovascular: Normal rate.   Pulmonary/Chest: Effort normal. No respiratory distress.  Genitourinary: Vagina normal and uterus normal. There is no rash, tenderness or lesion on the right labia. There is no rash, tenderness or lesion on the left labia. Cervix exhibits no motion tenderness, no discharge and no friability. Right adnexum displays no mass, no tenderness and no fullness. Left adnexum displays no mass, no tenderness and no fullness.       Moderate white vaginal discharge  Musculoskeletal: She exhibits no edema.  Neurological: She is alert and oriented to person, place, and time.          Assessment & Plan:

## 2010-10-23 NOTE — Assessment & Plan Note (Signed)
Pt encouraged to think with partner if this is the best time for them to conceive in the setting of her life goals.  Also, if she decides to continue to try to conceive, to take prenatal vitamin daily.

## 2010-10-23 NOTE — Assessment & Plan Note (Signed)
Due to pt's past exposure to STD's pt requests to be checked for both HIV and RPR again.

## 2010-10-23 NOTE — Telephone Encounter (Signed)
Attempt x 2 to call pt and let her know that I have sent in flagyl to her pharmacy for treatment of BV.  No answer.  Will try to call back tomorrow.  If pt calls back staff can give her this message.

## 2010-10-24 LAB — GC/CHLAMYDIA PROBE AMP, GENITAL: Chlamydia, DNA Probe: NEGATIVE

## 2010-10-25 ENCOUNTER — Telehealth: Payer: Self-pay | Admitting: Family Medicine

## 2010-10-25 NOTE — Telephone Encounter (Signed)
Patient informed of negative results, expressed understanding. 

## 2010-10-25 NOTE — Telephone Encounter (Signed)
Calling for results of labs

## 2010-10-26 ENCOUNTER — Telehealth: Payer: Self-pay | Admitting: Family Medicine

## 2010-10-26 NOTE — Telephone Encounter (Signed)
Called to discuss results with pt.  Left message.

## 2010-11-27 ENCOUNTER — Inpatient Hospital Stay (INDEPENDENT_AMBULATORY_CARE_PROVIDER_SITE_OTHER)
Admission: RE | Admit: 2010-11-27 | Discharge: 2010-11-27 | Disposition: A | Discharge status: Home / Self Care | Payer: Self-pay | Source: Ambulatory Visit | Attending: Family Medicine | Admitting: Family Medicine

## 2010-11-27 DIAGNOSIS — N390 Urinary tract infection, site not specified: Secondary | ICD-10-CM

## 2010-11-27 LAB — POCT URINALYSIS DIP (DEVICE)
Protein, ur: 100 mg/dL — AB
Specific Gravity, Urine: 1.02 (ref 1.005–1.030)
Urobilinogen, UA: 2 mg/dL — ABNORMAL HIGH (ref 0.0–1.0)
pH: 7.5 (ref 5.0–8.0)

## 2010-11-27 LAB — POCT PREGNANCY, URINE: Preg Test, Ur: NEGATIVE

## 2010-11-29 LAB — URINE CULTURE: Colony Count: >=100000

## 2010-11-30 ENCOUNTER — Encounter: Payer: Self-pay | Admitting: Family Medicine

## 2010-11-30 ENCOUNTER — Ambulatory Visit (INDEPENDENT_AMBULATORY_CARE_PROVIDER_SITE_OTHER): Payer: Self-pay | Admitting: Family Medicine

## 2010-11-30 VITALS — BP 102/70 | HR 80 | Temp 98.1°F | Ht 59.5 in | Wt 143.0 lb

## 2010-11-30 DIAGNOSIS — A6000 Herpesviral infection of urogenital system, unspecified: Secondary | ICD-10-CM | POA: Insufficient documentation

## 2010-11-30 MED ORDER — FLUCONAZOLE 150 MG PO TABS: 150.0000 mg | ORAL_TABLET | Freq: Once | ORAL | Status: AC

## 2010-11-30 MED ORDER — VALACYCLOVIR HCL 1 G PO TABS: 1000.0000 mg | ORAL_TABLET | Freq: Two times a day (BID) | ORAL | Status: DC

## 2010-11-30 MED ORDER — PHENAZOPYRIDINE HCL 200 MG PO TABS: 200.0000 mg | ORAL_TABLET | Freq: Three times a day (TID) | ORAL | Status: DC | PRN

## 2010-11-30 NOTE — Patient Instructions (Signed)
See info on genital herpes.   If you continue to have vaginal discharge, return for check

## 2010-11-30 NOTE — Assessment & Plan Note (Signed)
Clinical exam consistent with genital herpes.  Culture obtained.  Likely the reason for her pain.  Advised to finish cipro for culture positive UTI.  Patient thinks this is the first outbreak, will treat with valacyclovir x 7 days.  She is in disbelief that she could have contracted herpes.   Has had STD's in past, HIV, RPR, GC.CHl neg a little over a month ago.  Gave her a handout and discussed typical course, how infection is spread, and treatment.  Advised if culture is negative could do serology to see if she has ever had a past infection.  She will return for std testing if continues to have vaginal pain and discharge despite treatment of UTI and resolution of HSV outbreak.

## 2010-11-30 NOTE — Progress Notes (Signed)
  Subjective:    Patient ID: Jill Rush, female    DOB: 1990/12/05, 20 y.o.   MRN: [id number]  HPI Vaginal pain x 3-4 days.  Went to UC 10/1- got cipro for uti confirmed sensetivity of proteus >100K.  Continues to have vaginal pain and dysuria.  Also notes vaginal discharge.  Has wet prep and std testing in the past few months.  No new partner.  Denies abd pain, fever, urinary frequency, urgency.   Review of Systemssee above.     Objective:   Physical Exam  GEN: NAD Pelvic Exam:        External: normal female genitalia.  Several small papules on right labia patient states are "shaving bumps" mildly tender.  Grouping of several ulcers on an erythematous base in 6 o clock position.   perineum exquisitely tender.          Vagina: normal without lesions or masses       Unable to perform speculum or bimanual exam to due pain.        Assessment & Plan:

## 2010-12-01 ENCOUNTER — Telehealth: Payer: Self-pay | Admitting: Family Medicine

## 2010-12-01 MED ORDER — ACYCLOVIR 400 MG PO TABS: 400.0000 mg | ORAL_TABLET | Freq: Three times a day (TID) | ORAL | Status: AC

## 2010-12-01 NOTE — Telephone Encounter (Signed)
Jill Rush was in yesterday and was precribed Valtrex, but it is way too expensive.  Is there something she can take that is less expensive.  She uses [facility] on [address].  Please call her to let her know if this is possible.

## 2010-12-01 NOTE — Telephone Encounter (Signed)
Please call her i sent in acyclovir instead for her.  Should be on [facility] $9.99 drug list

## 2010-12-04 ENCOUNTER — Encounter: Payer: Self-pay | Admitting: Family Medicine

## 2010-12-04 LAB — HERPES SIMPLEX VIRUS CULTURE: Organism ID, Bacteria: DETECTED

## 2010-12-07 LAB — URINE MICROSCOPIC-ADD ON

## 2010-12-07 LAB — POCT URINALYSIS DIP (DEVICE)
Ketones, ur: 15 — AB
Protein, ur: 100 — AB
Specific Gravity, Urine: 1.025
Urobilinogen, UA: >=8
pH: 7

## 2010-12-07 LAB — URINALYSIS, ROUTINE W REFLEX MICROSCOPIC
Nitrite: POSITIVE — AB
Specific Gravity, Urine: 1.028
Urobilinogen, UA: 2 — ABNORMAL HIGH

## 2010-12-07 LAB — URINE CULTURE

## 2010-12-11 ENCOUNTER — Telehealth: Payer: Self-pay | Admitting: Family Medicine

## 2010-12-11 NOTE — Telephone Encounter (Signed)
Jill Rush, Spoke patient and informed her that results were positive. She stated that it has cleared up and no pain in vaginal area. She was wondering if she was going to get put on medication monthly or when she only has break out. ---[place] Dec

## 2010-12-11 NOTE — Telephone Encounter (Signed)
Please tell her for now, only when she breaks out because we cannot predict when the next outbreak will be- it may be a year from now or later.  We only put people on every day medicine when they have frequent outbreaks.

## 2010-12-11 NOTE — Telephone Encounter (Signed)
Is calling for her lab results. °

## 2010-12-12 LAB — POCT INFECTIOUS MONO SCREEN: Mono Screen: POSITIVE — AB

## 2010-12-12 LAB — POCT RAPID STREP A: Streptococcus, Group A Screen (Direct): NEGATIVE

## 2010-12-30 ENCOUNTER — Encounter (HOSPITAL_COMMUNITY): Payer: Self-pay

## 2010-12-30 ENCOUNTER — Inpatient Hospital Stay (HOSPITAL_COMMUNITY)
Admission: AD | Admit: 2010-12-30 | Discharge: 2010-12-30 | Disposition: A | Discharge status: Home / Self Care | Payer: Self-pay | Source: Ambulatory Visit | Attending: Family Medicine | Admitting: Family Medicine

## 2010-12-30 DIAGNOSIS — N912 Amenorrhea, unspecified: Secondary | ICD-10-CM | POA: Insufficient documentation

## 2010-12-30 DIAGNOSIS — Z3202 Encounter for pregnancy test, result negative: Secondary | ICD-10-CM

## 2010-12-30 HISTORY — DX: Other specified health status: Z78.9

## 2010-12-30 LAB — POCT PREGNANCY, URINE: Preg Test, Ur: NEGATIVE

## 2010-12-30 NOTE — ED Provider Notes (Signed)
History     Chief Complaint  Patient presents with  . Amenorrhea   HPI This is a 20 year old female who presents for late menses. She has been on Depo-Provera for the past year, but missed her dose in October to "give her body a rest". She has not had a period since that time. She denies vaginal discharge, abdominal cramping, tender breasts, spotting. She has not been using any birth control but has been sexually active.  OB History    Grav Para Term Preterm Abortions TAB SAB Ect Mult Living   1    1 1           Past Medical History  Diagnosis Date  . No pertinent past medical history     History reviewed. No pertinent past surgical history.  Family History  Problem Relation Age of Onset  . Hypertension Mother   . Diabetes Sister     with pregnancy    History  Substance Use Topics  . Smoking status: Never Smoker   . Smokeless tobacco: Never Used  . Alcohol Use: 0.5 oz/week    1 drink(s) per week    Allergies: No Known Allergies  Prescriptions prior to admission  Medication Sig Dispense Refill  . cetirizine (ZYRTEC) 10 MG tablet Take 1 tablet (10 mg total) by mouth daily.  30 tablet  3  . medroxyPROGESTERone (DEPO-PROVERA) 150 MG/ML injection Inject 150 mg into the muscle every 3 (three) months.        . phenazopyridine (PYRIDIUM) 200 MG tablet Take 1 tablet (200 mg total) by mouth 3 (three) times daily as needed for pain.  20 tablet  0    Review of Systems  All other systems reviewed and are negative.   Physical Exam   Blood pressure 118/71, pulse 75, temperature 98.5 F (36.9 C), temperature source Oral, resp. rate 18, height 4\' 11"  (1.499 m), weight 64.048 kg (141 lb 3.2 oz), last menstrual period 11/11/2010.  Physical Exam  Constitutional: She is oriented to person, place, and time. She appears well-developed and well-nourished.  HENT:  Head: Normocephalic and atraumatic.  Eyes: Pupils are equal, round, and reactive to light.  Neurological: She is alert  and oriented to person, place, and time.  Skin: Skin is warm and dry.    MAU Course  Procedures  Assessment and Plan  #1 question pregnancy  Discussed with patient that she is not pregnant. Did suggest plan B for emergency contraception.  Also recommended that she followup with her primary care provider for contraception management.  STINSON, JACOB JEHIEL 12/30/2010, 6:02 PM

## 2010-12-30 NOTE — Progress Notes (Signed)
Pt had depo shot in July. Did not get shot in October (wanted to give her body a rest) Has short regular period on depo. Did not have a period in October. Took 2 HPT both where negative.

## 2011-01-06 ENCOUNTER — Emergency Department (HOSPITAL_COMMUNITY): Payer: Self-pay

## 2011-01-06 ENCOUNTER — Emergency Department (HOSPITAL_COMMUNITY)
Admission: EM | Admit: 2011-01-06 | Discharge: 2011-01-06 | Disposition: A | Discharge status: Home / Self Care | Payer: Self-pay | Attending: Emergency Medicine | Admitting: Emergency Medicine

## 2011-01-06 ENCOUNTER — Encounter (HOSPITAL_COMMUNITY): Payer: Self-pay | Admitting: Emergency Medicine

## 2011-01-06 DIAGNOSIS — Z1881 Retained glass fragments: Secondary | ICD-10-CM | POA: Insufficient documentation

## 2011-01-06 DIAGNOSIS — Y92410 Unspecified street and highway as the place of occurrence of the external cause: Secondary | ICD-10-CM | POA: Insufficient documentation

## 2011-01-06 DIAGNOSIS — Z79899 Other long term (current) drug therapy: Secondary | ICD-10-CM | POA: Insufficient documentation

## 2011-01-06 DIAGNOSIS — IMO0002 Reserved for concepts with insufficient information to code with codable children: Secondary | ICD-10-CM | POA: Insufficient documentation

## 2011-01-06 DIAGNOSIS — M542 Cervicalgia: Secondary | ICD-10-CM | POA: Insufficient documentation

## 2011-01-06 DIAGNOSIS — R51 Headache: Secondary | ICD-10-CM | POA: Insufficient documentation

## 2011-01-06 DIAGNOSIS — R070 Pain in throat: Secondary | ICD-10-CM | POA: Insufficient documentation

## 2011-01-06 DIAGNOSIS — M795 Residual foreign body in soft tissue: Secondary | ICD-10-CM | POA: Insufficient documentation

## 2011-01-06 MED ORDER — IBUPROFEN 200 MG PO TABS: 600.0000 mg | ORAL_TABLET | Freq: Once | ORAL | Status: DC

## 2011-01-06 MED ORDER — METHOCARBAMOL 500 MG PO TABS: 500.0000 mg | ORAL_TABLET | Freq: Two times a day (BID) | ORAL | Status: AC

## 2011-01-06 NOTE — ED Notes (Signed)
[employer] bedside 
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
## 2011-01-06 NOTE — ED Notes (Signed)
Dr. Freida Busman in to see pt, removed neck brace, pt ambulated to bathroom without difficulty, Dr. Freida Busman aware of pt's headache, will monitor.
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
## 2011-01-16 ENCOUNTER — Ambulatory Visit: Payer: Self-pay

## 2011-01-23 ENCOUNTER — Encounter: Payer: Self-pay | Admitting: Family Medicine

## 2011-01-23 ENCOUNTER — Ambulatory Visit (INDEPENDENT_AMBULATORY_CARE_PROVIDER_SITE_OTHER): Payer: Self-pay | Admitting: Family Medicine

## 2011-01-23 ENCOUNTER — Telehealth: Payer: Self-pay | Admitting: Family Medicine

## 2011-01-23 VITALS — BP 113/72 | HR 86 | Temp 98.0°F | Ht 59.0 in | Wt 138.0 lb

## 2011-01-23 DIAGNOSIS — Z30011 Encounter for initial prescription of contraceptive pills: Secondary | ICD-10-CM

## 2011-01-23 DIAGNOSIS — N898 Other specified noninflammatory disorders of vagina: Secondary | ICD-10-CM

## 2011-01-23 DIAGNOSIS — Z3009 Encounter for other general counseling and advice on contraception: Secondary | ICD-10-CM

## 2011-01-23 DIAGNOSIS — N912 Amenorrhea, unspecified: Secondary | ICD-10-CM

## 2011-01-23 DIAGNOSIS — Z309 Encounter for contraceptive management, unspecified: Secondary | ICD-10-CM | POA: Insufficient documentation

## 2011-01-23 DIAGNOSIS — N760 Acute vaginitis: Secondary | ICD-10-CM

## 2011-01-23 DIAGNOSIS — A6000 Herpesviral infection of urogenital system, unspecified: Secondary | ICD-10-CM

## 2011-01-23 LAB — POCT WET PREP (WET MOUNT)
Clue Cells Wet Prep HPF POC: NEGATIVE
Trichomonas Wet Prep HPF POC: NEGATIVE

## 2011-01-23 LAB — POCT URINE PREGNANCY: Preg Test, Ur: NEGATIVE

## 2011-01-23 MED ORDER — NORGESTIMATE-ETH ESTRADIOL 0.25-35 MG-MCG PO TABS: 1.0000 | ORAL_TABLET | Freq: Every day | ORAL | Status: DC

## 2011-01-23 NOTE — Assessment & Plan Note (Signed)
Herpes lesion present on cervix. This may be because of her discharge.

## 2011-01-23 NOTE — Progress Notes (Signed)
  Subjective:    Patient ID: Jill Rush, female    DOB: 07-08-1990, 20 y.o.   MRN: [id number]  HPI  Patient comes in today complaining of vaginal discharge and amenorrhea x2 months. She states that she has had vaginal itching both on the outside and inside as well as a white thick discharge for one week. She does not have any odor. She has not have any burning with urination. She does not have any pain with sex. She denies any new partners. Patient states that she had her last Depo shot in May. Since that time she had a period in August but has not had a period since then. She's not having a pregnant at this time. She's currently using condoms as her only medicine is birth control. She is interested in trying the pill. She was on upon high school but stopped it because of it being convenient. She thinks she is ready to try it again.  Review of Systems No chest pain, headache, shortness of    Objective:   Physical Exam  Vital signs reviewed General appearance - alert, well appearing, and in no distress and oriented to person, place, and time GYN- external genetalia normal, without lesions.  Vagina normal color, rugations, white discharge present.  Cervix with a dime size erosion with a red base at 3 o'clock       Assessment & Plan:

## 2011-01-23 NOTE — Patient Instructions (Signed)
I will call you with your lab results. You should expect a call this afternoon or this evening. I will send you the results of your STD check in a letter.  I have sent the Sprintec to your pharmacy. You can start this on any day.  Please come back in one month check in about your birth control. I want to make sure that you are happy with this method and you have any questions.

## 2011-01-23 NOTE — Telephone Encounter (Signed)
Unable to leave VM due to inbox full.  Wet prep was negative.

## 2011-01-23 NOTE — Assessment & Plan Note (Addendum)
Vaginal discharge and itching today. We'll check wet prep and gonorrhea chlamydia. Will call patient with results. Discharge may be due to a herpes lesion on her cervix. Discussed this with patient.

## 2011-01-23 NOTE — Assessment & Plan Note (Signed)
Will start Sprintec today. Will have patient followup in one month to make of this is going well.

## 2011-01-24 ENCOUNTER — Telehealth: Payer: Self-pay | Admitting: Family Medicine

## 2011-01-24 LAB — GC/CHLAMYDIA PROBE AMP, GENITAL
Chlamydia, DNA Probe: NEGATIVE
GC Probe Amp, Genital: NEGATIVE

## 2011-01-24 MED ORDER — FLUCONAZOLE 150 MG PO TABS: ORAL_TABLET | ORAL | Status: DC

## 2011-01-24 NOTE — Telephone Encounter (Signed)
Was seen yesterday and is calling to see what she can do for the itching she has.  "driving her crazy"

## 2011-01-24 NOTE — Telephone Encounter (Signed)
Spoke with patient about her itching. Her tests all came back negative. I again explained to her that her itching may just be due to the discharge caused by the herpetic lesion on her cervix. However, her discharge did look like a yeast infection and it may be worth treating it as such. I discussed the over-the-counter option as well as the pill. The patient would like the pill called in which I will do today.

## 2011-01-26 ENCOUNTER — Other Ambulatory Visit: Payer: Self-pay | Admitting: Family Medicine

## 2011-01-26 NOTE — Telephone Encounter (Signed)
Called no answer.  The VM box was not set up so could not leave a message. No other number to try

## 2011-01-26 NOTE — Telephone Encounter (Signed)
[name] is wanting a refill on the medication for the pain due to  her Herpes sent to [facility] on [address].  She also want to know if she has to take the medication for the entire time.  She said it is ok to leave a message at this #.

## 2011-01-29 NOTE — Telephone Encounter (Signed)
Left vm for her to call and give time and number where can be reached

## 2011-02-13 NOTE — Telephone Encounter (Signed)
LMOVM informing pt to make an appt [place], Maryjo [place]

## 2011-02-13 NOTE — Telephone Encounter (Signed)
Needs an office visit Would not be sure what to call in without an exam Please notify her thanks

## 2011-02-13 NOTE — Telephone Encounter (Signed)
Pt asking to speak with RN, is itching again and would like another rx called in, wants to know if she should get something different?

## 2011-03-09 ENCOUNTER — Ambulatory Visit (INDEPENDENT_AMBULATORY_CARE_PROVIDER_SITE_OTHER): Payer: Self-pay | Admitting: Family Medicine

## 2011-03-09 ENCOUNTER — Telehealth: Payer: Self-pay | Admitting: Family Medicine

## 2011-03-09 ENCOUNTER — Encounter: Payer: Self-pay | Admitting: Family Medicine

## 2011-03-09 VITALS — BP 116/72 | HR 81 | Temp 98.1°F | Ht <= 58 in | Wt 143.0 lb

## 2011-03-09 DIAGNOSIS — N898 Other specified noninflammatory disorders of vagina: Secondary | ICD-10-CM

## 2011-03-09 DIAGNOSIS — A6000 Herpesviral infection of urogenital system, unspecified: Secondary | ICD-10-CM

## 2011-03-09 LAB — POCT WET PREP (WET MOUNT): Yeast Wet Prep HPF POC: NEGATIVE

## 2011-03-09 MED ORDER — METRONIDAZOLE 500 MG PO TABS: 500.0000 mg | ORAL_TABLET | Freq: Three times a day (TID) | ORAL | Status: AC

## 2011-03-09 MED ORDER — ACYCLOVIR 400 MG PO TABS: 800.0000 mg | ORAL_TABLET | Freq: Two times a day (BID) | ORAL | Status: AC

## 2011-03-09 NOTE — Progress Notes (Signed)
  Subjective:    Patient ID: Jill Rush, female    DOB: 05/31/1990, 21 y.o.   MRN: [id number]  HPI 1. Herpes genitalis outbreak Patient started to have burning on her labia majora. She has culture proven HSV1. She has taken valtrex in the past. She c/o white foul smelling discharge as well. Denies fever, abdominal pain, bleeding, dysuria, stool symptoms, back pain.  No recent sexual activity. Not on BCP.  Review of Systems Pertinent items are noted in HPI. No fever, chills, night sweats, weight loss.     Objective:   Physical Exam Filed Vitals:   03/09/11 0839  BP: 116/72  Pulse: 81  Temp: 98.1 F (36.7 C)  TempSrc: Oral  Height: 4\' 9"  (1.448 m)  Weight: 143 lb (64.864 kg)  Female genitalia: Vulva: vulvar lesion vesicles right labia majora and vulvar tenderness over vesicles Vagina: white fould smelling discharge Cervix: cervical discharge present - white and malodorous White foul smelling discharge present.      Assessment & Plan:

## 2011-03-09 NOTE — Assessment & Plan Note (Signed)
Herpes breakout on right labia majora. Will treat with acyclovir 800 mg BID for 5 days for episodic outbreak. Will give refills.

## 2011-03-09 NOTE — Progress Notes (Signed)
Addended by: Barnie Alderman on: 03/09/2011 09:15 AM   Modules accepted: Orders

## 2011-03-09 NOTE — Patient Instructions (Signed)
It was great to see you today!  Schedule an appointment to see your PCP as needed.  I sent in a prescription for acyclovir for your Herpes outbreak. I gave you refills as well for the future.  I will send in antibiotics if your discharge shows yeast or BV.

## 2011-03-09 NOTE — Telephone Encounter (Signed)
Reviewed patients results.

## 2011-11-07 ENCOUNTER — Ambulatory Visit: Payer: Self-pay | Admitting: Family Medicine

## 2011-11-21 ENCOUNTER — Ambulatory Visit (INDEPENDENT_AMBULATORY_CARE_PROVIDER_SITE_OTHER): Payer: Self-pay | Admitting: Family Medicine

## 2011-11-21 ENCOUNTER — Encounter: Payer: Self-pay | Admitting: Family Medicine

## 2011-11-21 VITALS — BP 104/63 | HR 80 | Temp 98.0°F | Ht <= 58 in | Wt 161.0 lb

## 2011-11-21 DIAGNOSIS — Z309 Encounter for contraceptive management, unspecified: Secondary | ICD-10-CM

## 2011-11-21 DIAGNOSIS — Z3009 Encounter for other general counseling and advice on contraception: Secondary | ICD-10-CM

## 2011-11-21 DIAGNOSIS — Z30011 Encounter for initial prescription of contraceptive pills: Secondary | ICD-10-CM

## 2011-11-21 MED ORDER — NORGESTIMATE-ETH ESTRADIOL 0.25-35 MG-MCG PO TABS: 1.0000 | ORAL_TABLET | Freq: Every day | ORAL | Status: DC

## 2011-11-21 NOTE — Patient Instructions (Addendum)
Start the sprintec daily same time each day  Your menstrual periods may be irregular for the first few months  If you get swelling of your legs or severe headache or any weakness of an arm or leg call us immediately  You should have female exam with cultures when you get insurance

## 2011-11-21 NOTE — Assessment & Plan Note (Signed)
Start today.  Went over warnings and safe sex.

## 2011-11-21 NOTE — Progress Notes (Signed)
  Subjective:    Patient ID: Jill Rush, female    DOB: [birth year], 21 y.o.   MRN: [id number]  HPI Contraception Wants to restart bcps.  Did not have any problems she can remember before.  No headache or leg swelling or hypertension.  LMP 9-10 and 8-13  Keeps in her phone.  Last sec beginning of August.  No vaginal discharge or sores or recurrence of herpes  No personal history of hypertension or blood clots    Review of Systems     Objective:   Physical Exam  Heart - Regular rate and rhythm.  No murmurs, gallops or rubs.    Lungs:  Normal respiratory effort, chest expands symmetrically. Lungs are clear to auscultation, no crackles or wheezes. Extremities:  No cyanosis, edema, or deformity noted with good range of motion of all major joints.         Assessment & Plan:

## 2011-12-26 ENCOUNTER — Encounter: Payer: Self-pay | Admitting: Family Medicine

## 2012-01-07 ENCOUNTER — Encounter: Payer: Self-pay | Admitting: Family Medicine

## 2012-01-09 ENCOUNTER — Encounter: Payer: Self-pay | Admitting: Family Medicine

## 2012-01-18 ENCOUNTER — Emergency Department (INDEPENDENT_AMBULATORY_CARE_PROVIDER_SITE_OTHER)
Admission: EM | Admit: 2012-01-18 | Discharge: 2012-01-18 | Disposition: A | Discharge status: Home / Self Care | Payer: Medicaid Other | Source: Home / Self Care | Attending: Family Medicine | Admitting: Family Medicine

## 2012-01-18 ENCOUNTER — Other Ambulatory Visit (HOSPITAL_COMMUNITY)
Admission: RE | Admit: 2012-01-18 | Discharge: 2012-01-18 | Disposition: A | Discharge status: Home / Self Care | Payer: Self-pay | Source: Ambulatory Visit | Attending: Family Medicine | Admitting: Family Medicine

## 2012-01-18 ENCOUNTER — Encounter (HOSPITAL_COMMUNITY): Payer: Self-pay | Admitting: *Deleted

## 2012-01-18 DIAGNOSIS — Z113 Encounter for screening for infections with a predominantly sexual mode of transmission: Secondary | ICD-10-CM | POA: Insufficient documentation

## 2012-01-18 DIAGNOSIS — B9689 Other specified bacterial agents as the cause of diseases classified elsewhere: Secondary | ICD-10-CM

## 2012-01-18 DIAGNOSIS — N760 Acute vaginitis: Secondary | ICD-10-CM

## 2012-01-18 DIAGNOSIS — N898 Other specified noninflammatory disorders of vagina: Secondary | ICD-10-CM

## 2012-01-18 DIAGNOSIS — A499 Bacterial infection, unspecified: Secondary | ICD-10-CM

## 2012-01-18 LAB — POCT URINALYSIS DIP (DEVICE)
Glucose, UA: NEGATIVE mg/dL
Hgb urine dipstick: NEGATIVE
Specific Gravity, Urine: 1.025 (ref 1.005–1.030)
Urobilinogen, UA: 2 mg/dL — ABNORMAL HIGH (ref 0.0–1.0)

## 2012-01-18 LAB — HIV ANTIBODY (ROUTINE TESTING W REFLEX): HIV: NONREACTIVE

## 2012-01-18 LAB — POCT PREGNANCY, URINE: Preg Test, Ur: NEGATIVE

## 2012-01-18 NOTE — ED Provider Notes (Addendum)
History     CSN: [id number]  Arrival date & time 01/18/12  4098   First MD Initiated Contact with Patient 01/18/12 [phone]      Chief Complaint  Patient presents with  . Vaginal Discharge    (Consider location/radiation/quality/duration/timing/severity/associated sxs/prior treatment) Patient is a 21 y.o. female presenting with vaginal discharge. The history is provided by the patient.  Vaginal Discharge This is a new problem. The current episode started more than 1 week ago.  21 y.o. female complains of white nonirritating vaginal discharge for 3 days, symptoms have since subsided.  Denies abnormal vaginal bleeding, significant pelvic pain or fever. No UTI symptoms. Sexually active, does not use condoms, no change in partner.  Last unprotected intercourse 1 month ago.  Denies history of known exposure to STD or symptoms in partner.   +chlamydia.     Past Medical History  Diagnosis Date  . No pertinent past medical history     History reviewed. No pertinent past surgical history.  Family History  Problem Relation Age of Onset  . Hypertension Mother   . Diabetes Sister     with pregnancy    History  Substance Use Topics  . Smoking status: Never Smoker   . Smokeless tobacco: Never Used  . Alcohol Use: 0.5 oz/week    1 drink(s) per week    OB History    Grav Para Term Preterm Abortions TAB SAB Ect Mult Living   1    1 1           Review of Systems  Genitourinary: Positive for vaginal discharge.  All other systems reviewed and are negative.    Allergies  Review of patient's allergies indicates no known allergies.  Home Medications   Current Outpatient Rx  Name  Route  Sig  Dispense  Refill  . METRONIDAZOLE 500 MG PO TABS   Oral   Take 1 tablet (500 mg total) by mouth 2 (two) times daily.   14 tablet   0   . NORGESTIMATE-ETH ESTRADIOL 0.25-35 MG-MCG PO TABS   Oral   Take 1 tablet by mouth daily.   1 Package   11     BP 123/75  Pulse 76  Temp 98.4  F (36.9 C) (Oral)  Resp 16  SpO2 99%  Physical Exam  Nursing note and vitals reviewed. Constitutional: She is oriented to person, place, and time. Vital signs are normal. She appears well-developed and well-nourished. She is active and cooperative.  HENT:  Head: Normocephalic.  Mouth/Throat: Oropharynx is clear and moist. No oropharyngeal exudate.  Eyes: Conjunctivae normal and EOM are normal. Pupils are equal, round, and reactive to light. No scleral icterus.  Neck: Trachea normal and normal range of motion. Neck supple.  Cardiovascular: Normal rate and regular rhythm.   Pulmonary/Chest: Effort normal and breath sounds normal.  Abdominal: Soft. Bowel sounds are normal. There is no tenderness. There is no CVA tenderness.  Genitourinary: Pelvic exam was performed with patient supine. No labial fusion. There is no rash, tenderness, lesion or injury on the right labia. There is no rash, tenderness, lesion or injury on the left labia. No erythema, tenderness or bleeding around the vagina. No foreign body around the vagina. No signs of injury around the vagina. Vaginal discharge found.  Lymphadenopathy:    She has no cervical adenopathy.       Right: No inguinal adenopathy present.       Left: No inguinal adenopathy present.  Neurological: She is alert and  oriented to person, place, and time. No cranial nerve deficit or sensory deficit.  Skin: Skin is warm and dry. No rash noted.  Psychiatric: She has a normal mood and affect. Her speech is normal and behavior is normal. Judgment and thought content normal. Cognition and memory are normal.    ED Course  Procedures (including critical care time)  Labs Reviewed  POCT URINALYSIS DIP (DEVICE) - Abnormal; Notable for the following:    Bilirubin Urine SMALL (*)     Ketones, ur TRACE (*)     Protein, ur 30 (*)     Urobilinogen, UA 2.0 (*)     All other components within normal limits  CERVICOVAGINAL ANCILLARY ONLY  RPR  HIV ANTIBODY  (ROUTINE TESTING)  POCT PREGNANCY, URINE  LAB REPORT - SCANNED  CERVICOVAGINAL ANCILLARY ONLY   No results found.   1. Vaginal discharge   2. Bacterial vaginosis       MDM  Await wet prep, gc/ct, rpr, hiv test results.  Condoms for STD prevention.  Establish gyn care with provider for birth control and year pap screening.    Rx metronidazole sent to pharmacy, pt made aware.      Johnsie Kindred, NP 01/18/12 0935  Johnsie Kindred, NP 01/21/12 1441

## 2012-01-18 NOTE — ED Notes (Signed)
Pt  Reports  Symptoms  Of  Vaginal  Discharge    X 2  Weeks    Pt  States  Actually  It  Has   Ceased     -  She  Ambulates  Upright  With a  Steady  Fluid  Gait            -   Pt          denys  Any      Cramping

## 2012-01-18 NOTE — ED Provider Notes (Signed)
Medical screening examination/treatment/procedure(s) were performed by resident physician or non-physician practitioner and as supervising physician I was immediately available for consultation/collaboration.   Barkley Bruns MD.    Linna Hoff, MD 01/18/12 1328

## 2012-01-19 ENCOUNTER — Telehealth (HOSPITAL_COMMUNITY): Payer: Self-pay | Admitting: Emergency Medicine

## 2012-01-19 NOTE — ED Notes (Signed)
Patient called requesting lab results.  Results were not in the computer yet.  Patient made aware that it will probably be Tuesday before they are back.  Patient expressed understanding

## 2012-01-21 ENCOUNTER — Telehealth (HOSPITAL_COMMUNITY): Payer: Self-pay | Admitting: *Deleted

## 2012-01-21 MED ORDER — METRONIDAZOLE 500 MG PO TABS: 500.0000 mg | ORAL_TABLET | Freq: Two times a day (BID) | ORAL | Status: DC

## 2012-01-21 NOTE — ED Notes (Signed)
GC/Chlamydia neg., HIV/RPR non-reactive, Wet prep: Gardernella positive.  Lab shown to Lannie Fields NP and she e- prescribed Flagyl to the [place] at [facility]. I called pt. Pt. verified x 2 and given results.  Pt. Told she needs tx. for bacterial vaginosis with Flagyl. Pt.'s questions about bacterial vaginosis answered.  Pt. instructed to no alcohol while taking this medication. Pt. told she where to pick up Rx.  Pt. voiced understanding. Vassie Moselle 01/21/2012

## 2012-01-21 NOTE — ED Provider Notes (Signed)
Medical screening examination/treatment/procedure(s) were performed by resident physician or non-physician practitioner and as supervising physician I was immediately available for consultation/collaboration.   Barkley Bruns MD.    Linna Hoff, MD 01/21/12 [phone]

## 2012-01-21 NOTE — ED Notes (Signed)
Accessed record for registration, patient requesting lab results

## 2012-02-05 ENCOUNTER — Ambulatory Visit (INDEPENDENT_AMBULATORY_CARE_PROVIDER_SITE_OTHER): Payer: Medicaid Other | Admitting: Family Medicine

## 2012-02-05 VITALS — BP 111/64 | HR 76 | Temp 98.8°F | Ht 59.0 in | Wt 157.9 lb

## 2012-02-05 DIAGNOSIS — L293 Anogenital pruritus, unspecified: Secondary | ICD-10-CM

## 2012-02-05 DIAGNOSIS — N898 Other specified noninflammatory disorders of vagina: Secondary | ICD-10-CM | POA: Insufficient documentation

## 2012-02-05 DIAGNOSIS — N760 Acute vaginitis: Secondary | ICD-10-CM

## 2012-02-05 LAB — POCT WET PREP (WET MOUNT)
Clue Cells Wet Prep Whiff POC: NEGATIVE
WBC, Wet Prep HPF POC: >20

## 2012-02-05 MED ORDER — FLUCONAZOLE 150 MG PO TABS: 150.0000 mg | ORAL_TABLET | Freq: Once | ORAL | Status: DC

## 2012-02-05 MED ORDER — NYSTATIN 100000 UNIT/GM EX CREA: TOPICAL_CREAM | Freq: Two times a day (BID) | CUTANEOUS | Status: DC

## 2012-02-05 NOTE — Assessment & Plan Note (Addendum)
Wet prep showed yeast infection. Plan: Nystatin cream to apply around vulva. Fluconazole x1.

## 2012-02-05 NOTE — Patient Instructions (Addendum)
Candidal Vulvovaginitis Candidal vulvovaginitis is an infection of the vagina and vulva. The vulva is the skin around the opening of the vagina. This may cause itching and discomfort in and around the vagina.  HOME CARE Only take medicine as told by your doctor. Do not have sex (intercourse) until the infection is healed or as told by your doctor. Practice safe sex. Tell your sex partner about your infection. Do not douche or use tampons. Wear cotton underwear. Do not wear tight pants or panty hose. Eat yogurt. This may help treat and prevent yeast infections. GET HELP RIGHT AWAY IF:  You have a fever. Your problems get worse during treatment or do not get better in 3 days. You have discomfort, irritation, or itching in your vagina or vulva area. You have pain after sex. You start to get belly (abdominal) pain. MAKE SURE YOU: Understand these instructions. Will watch your condition. Will get help right away if you are not doing well or get worse. Document Released: 05/11/2008 Document Revised: 05/07/2011 Document Reviewed: 05/11/2008 [facility] Patient Information 2013 [place], [place].    Practice safe sex by using condoms. SEEK MEDICAL CARE IF:   You have abdominal pain.  Your symptoms get worse during treatment. Document Released: 12/10/2006 Document Revised: 05/07/2011 Document Reviewed: 08/05/2008 [facility] Patient Information 2013 [place], [place].

## 2012-02-05 NOTE — Progress Notes (Signed)
Family Medicine Office Visit Note   Subjective:   Patient ID: Jill Rush, female  DOB: 02/16/1991, 21 y.o.. MRN: [id number]   Pt that comes today complaining of vaginal pruritus. Denies discharge. Pt was treated for Gardnerella recently and reports completed treatment  with Metronidazole. Last Sexual intercourse was a month ago with stable partner. Not concerned about STD's (GC and CT, RPR and HIV were negative) she reports finished her menses this past Monday.  Review of Systems:  Per HPI  Objective:   Physical Exam: Gen:  NAD Vulva with mild erythema, no lesions.  Perianal area: Normal  Speculum: Vagina normal appearance, thin white discharge. Pt did not cooperate to complete speculum vaginal exam.    Assessment & Plan:

## 2012-02-13 ENCOUNTER — Encounter: Payer: Self-pay | Admitting: Family Medicine

## 2012-02-13 ENCOUNTER — Other Ambulatory Visit (HOSPITAL_COMMUNITY)
Admission: RE | Admit: 2012-02-13 | Discharge: 2012-02-13 | Disposition: A | Discharge status: Home / Self Care | Payer: Medicaid Other | Source: Ambulatory Visit | Attending: Family Medicine | Admitting: Family Medicine

## 2012-02-13 ENCOUNTER — Ambulatory Visit (INDEPENDENT_AMBULATORY_CARE_PROVIDER_SITE_OTHER): Payer: Medicaid Other | Admitting: Family Medicine

## 2012-02-13 VITALS — BP 114/74 | HR 80 | Temp 98.2°F | Ht 59.5 in | Wt 157.0 lb

## 2012-02-13 DIAGNOSIS — Z124 Encounter for screening for malignant neoplasm of cervix: Secondary | ICD-10-CM

## 2012-02-13 DIAGNOSIS — N898 Other specified noninflammatory disorders of vagina: Secondary | ICD-10-CM

## 2012-02-13 DIAGNOSIS — Z113 Encounter for screening for infections with a predominantly sexual mode of transmission: Secondary | ICD-10-CM | POA: Insufficient documentation

## 2012-02-13 DIAGNOSIS — Z01419 Encounter for gynecological examination (general) (routine) without abnormal findings: Secondary | ICD-10-CM

## 2012-02-13 LAB — POCT WET PREP (WET MOUNT): Clue Cells Wet Prep Whiff POC: POSITIVE

## 2012-02-13 NOTE — Patient Instructions (Addendum)
Stop all vaginal medications.  If you still have any symptoms by Friday give [place] a call in the AM  Your ideal weight is around around 130-135.  You may want to try to lose about 2 lbs a week

## 2012-02-13 NOTE — Progress Notes (Signed)
  Subjective:    Patient ID: Jill Rush, female    DOB: 20-Aug-1990, 21 y.o.   MRN: [id number]  HPI  Here for Gyn exam Feels well except her vaginal area still feels irritated after recent treatment for yeast.  It is better but still irritated when she showers.  No sores or discharge.   Used monistat topical 2 days ago.  Contraception Taking bcp regularly with normal regular menstrual periods   Patient Information Form: Screening and ROS   Review of Symptoms  General:  Negative for nexplained weight loss, fever Skin: Negative for new or changing mole, sore that won't heal HEENT: Negative for trouble hearing, trouble seeing, ringing in ears, mouth sores, hoarseness, change in voice, dysphagia. CV:  Negative for chest pain, dyspnea, edema, palpitations Resp: Negative for cough, dyspnea, hemoptysis GI: Negative for nausea, vomiting, diarrhea, constipation, abdominal pain, melena, hematochezia. GU: Negative for dysuria, incontinence, urinary hesitance, hematuria, vaginal or penile discharge, polyuria, sexual difficulty, lumps in testicle or breasts MSK: Negative for muscle cramps or aches, joint pain or swelling Neuro: Negative for headaches, weakness, numbness, dizziness, passing out/fainting Psych: Negative for depression, anxiety, memory problems     Review of Systems     Objective:   Physical Exam Genitalia:  Normal introitus for age, no external lesions, white thick material in vagina, mucosa pink and moist, no vaginal or cervical lesions, no vaginal atrophy, no friaility or hemorrhage, normal uterus size and position, no adnexal masses or tenderness Pap and cultures done       Assessment & Plan:

## 2012-03-29 ENCOUNTER — Emergency Department (HOSPITAL_COMMUNITY): Payer: Medicaid Other

## 2012-03-29 ENCOUNTER — Encounter (HOSPITAL_COMMUNITY): Payer: Self-pay | Admitting: Nurse Practitioner

## 2012-03-29 ENCOUNTER — Emergency Department (HOSPITAL_COMMUNITY)
Admission: EM | Admit: 2012-03-29 | Discharge: 2012-03-29 | Disposition: A | Discharge status: Home / Self Care | Payer: Medicaid Other | Attending: Emergency Medicine | Admitting: Emergency Medicine

## 2012-03-29 DIAGNOSIS — R0789 Other chest pain: Secondary | ICD-10-CM

## 2012-03-29 DIAGNOSIS — R071 Chest pain on breathing: Secondary | ICD-10-CM | POA: Insufficient documentation

## 2012-03-29 LAB — CBC
Platelets: 264 10*3/uL (ref 150–400)
RDW: 13.4 % (ref 11.5–15.5)
WBC: 7.9 10*3/uL (ref 4.0–10.5)

## 2012-03-29 LAB — D-DIMER, QUANTITATIVE: D-Dimer, Quant: 0.27 ug/mL-FEU (ref 0.00–0.48)

## 2012-03-29 LAB — BASIC METABOLIC PANEL
Chloride: 103 mEq/L (ref 96–112)
GFR calc Af Amer: >90 mL/min (ref >90)
Potassium: 3.8 mEq/L (ref 3.5–5.1)
Sodium: 137 mEq/L (ref 135–145)

## 2012-03-29 LAB — POCT I-STAT TROPONIN I

## 2012-03-29 MED ORDER — IBUPROFEN 800 MG PO TABS
800.0000 mg | ORAL_TABLET | Freq: Once | ORAL | Status: AC
  Administered 2012-03-29: 800 mg via ORAL
  Filled 2012-03-29: qty 1

## 2012-03-29 NOTE — ED Notes (Signed)
Ticket to ride given to transporter, Brion Aliment.

## 2012-03-29 NOTE — ED Notes (Signed)
Pt ambulatory leaving ED with friend; pt given d/c teaching and follow-up care instructions; pt verbalized understanding of d/c teaching. Pt has no further questions upon d/c. Pt does not appear to be in acute distress upon d/c.

## 2012-03-29 NOTE — ED Provider Notes (Signed)
History     CSN: [id number]  Arrival date & time 03/29/12  1909   First MD Initiated Contact with Patient 03/29/12 1920      Chief Complaint  Patient presents with  . Chest Pain    (Consider location/radiation/quality/duration/timing/severity/associated sxs/prior treatment) HPI Comments: Patient presents with right-sided anterior chest pain onset 1 hour ago while watching television. It is worse with movement and worse with breathing. It does not radiate. Denies any shortness of breath, nausea, vomiting or cough. Denies any leg pain or swelling. Denies any recent travel. Denies any recent prolonged immobilization or surgical procedure. She is not on anticoagulation. She's not birth-control. She reports no history of cardiac problems. She did not take anything for pain.  The history is provided by the patient.    Past Medical History  Diagnosis Date  . No pertinent past medical history     History reviewed. No pertinent past surgical history.  Family History  Problem Relation Age of Onset  . Hypertension Mother   . Diabetes Sister     with pregnancy    History  Substance Use Topics  . Smoking status: Never Smoker   . Smokeless tobacco: Never Used  . Alcohol Use: 0.5 oz/week    1 drink(s) per week    OB History    Grav Para Term Preterm Abortions TAB SAB Ect Mult Living   1    1 1           Review of Systems  Constitutional: Negative for fever, activity change and appetite change.  HENT: Negative for congestion and rhinorrhea.   Respiratory: Negative for cough, chest tightness and shortness of breath.   Cardiovascular: Positive for chest pain.  Gastrointestinal: Negative for nausea, vomiting and abdominal pain.  Genitourinary: Negative for dysuria, hematuria, vaginal bleeding and vaginal discharge.  Musculoskeletal: Negative for back pain.  Skin: Negative for rash.  Neurological: Negative for dizziness, weakness and headaches.  A complete 10 system review of  systems was obtained and all systems are negative except as noted in the HPI and PMH.    Allergies  Review of patient's allergies indicates no known allergies.  Home Medications   Current Outpatient Rx  Name  Route  Sig  Dispense  Refill  . ACETAMINOPHEN 500 MG PO TABS   Oral   Take 1,000 mg by mouth every 6 (six) hours as needed. For pain           BP 111/68  Pulse 72  Temp 98.4 F (36.9 C) (Oral)  Resp 16  SpO2 100%  LMP 03/02/2012  Physical Exam  Constitutional: She is oriented to person, place, and time. She appears well-developed and well-nourished. No distress.  HENT:  Head: Normocephalic and atraumatic.  Mouth/Throat: Oropharynx is clear and moist. No oropharyngeal exudate.  Eyes: Conjunctivae normal and EOM are normal.  Neck: Normal range of motion. Neck supple.  Cardiovascular: Normal rate, regular rhythm and normal heart sounds.   No murmur heard. Pulmonary/Chest: Effort normal and breath sounds normal. No respiratory distress. She exhibits tenderness.       Right-sided chest wall pain, worse with arm movement, worse with expanding chest  Abdominal: Soft. There is no tenderness. There is no rebound and no guarding.  Musculoskeletal: Normal range of motion. She exhibits no edema and no tenderness.  Neurological: She is alert and oriented to person, place, and time. No cranial nerve deficit. She exhibits normal muscle tone. Coordination normal.  Skin: Skin is warm.    ED  Course  Procedures (including critical care time)   Labs Reviewed  CBC  BASIC METABOLIC PANEL  D-DIMER, QUANTITATIVE  POCT I-STAT TROPONIN I  LAB REPORT - SCANNED   Dg Chest 2 View  03/29/2012  *RADIOLOGY REPORT*  Clinical Data: Chest pain  CHEST - 2 VIEW  Comparison:  None.  Findings:  The heart size and mediastinal contours are within normal limits.  Both lungs are clear.  The visualized skeletal structures are unremarkable.  IMPRESSION: No active cardiopulmonary disease.   Original  Report Authenticated By: Myles Rosenthal, M.D.      1. Chest wall pain       MDM  Atypical chest pain and otherwise healthy 22 year old. No shortness of breath, nausea or vomiting. Pain is reproducible on exam and worse with arm movement. She is not on birth control. PERC negative.  Chest x-ray negative. No arrhythmias an EKG. Labs ordered in triage but not considered in decision-making this patient's pain is atypical and consistent with musculoskeletal chest pain. Labs pending at time of signout PA Dammen.   Date: 03/29/2012  Rate: 72  Rhythm: normal sinus rhythm  QRS Axis: right  Intervals: normal  ST/T Wave abnormalities: normal  Conduction Disutrbances:none  Narrative Interpretation:   Old EKG Reviewed: none available     Nurse concerned for Afib on monitor.  Some sinus arrhythmia seen on strip review but P waves remain evident.  Repeat EKG unchanged.    Date: 03/29/2012  Rate: 72  Rhythm: normal sinus rhythm  QRS Axis: right  Intervals: normal  ST/T Wave abnormalities: normal  Conduction Disutrbances:none  Narrative Interpretation:   Old EKG Reviewed: unchanged    Glynn Octave, MD 03/30/12 1542

## 2012-03-29 NOTE — ED Notes (Signed)
Pt states chest pain started about 1 hour ago; pt states she was lying watching tv when started; pt states on right side into back; pt denies N/V. Pt denies numbness/tingling. Pt denies SOB/difficulty breathing. Pt denies lightheadedness/dizziness. Pt alert and mentating appropriately.

## 2012-03-29 NOTE — ED Notes (Signed)
Pt returned from xray placed back on monitor 

## 2012-03-29 NOTE — ED Notes (Signed)
Pt reports midsternal CP onset 1 hour ago while at rest. States pain is worse when she moves and breathes. Denies cardiac history. A&Ox4, resp e/u

## 2012-03-29 NOTE — ED Notes (Signed)
Dr. Rancour at bedside. 

## 2012-03-30 NOTE — ED Provider Notes (Signed)
Jill Rush S 8:00 PM patient discussed in sign out with Dr. Manus Gunning.  Patient is a healthy 22 year old female with no other significant past medical history who presented with chest pain while sitting down watching TV. Pain is primary over the right sternal and chest area. It does not radiate significantly. Pain is slightly worse with breathing though patient denies any shortness of breath at this time. No hemoptysis. No recent travel. No prior history of DVT or PE. Patient on estrogen or birth control. No active diagnosis of cancer. Patient is PERC negative.    Additional lab tests are pending.    Results for orders placed during the hospital encounter of 03/29/12  CBC      Component Value Range   WBC 7.9  4.0 - 10.5 K/uL   RBC 4.35  3.87 - 5.11 MIL/uL   Hemoglobin 13.9  12.0 - 15.0 g/dL   HCT 40.9  81.1 - 91.4 %   MCV 91.3  78.0 - 100.0 fL   MCH 32.0  26.0 - 34.0 pg   MCHC 35.0  30.0 - 36.0 g/dL   RDW 78.2  95.6 - 21.3 %   Platelets 264  150 - 400 K/uL  BASIC METABOLIC PANEL      Component Value Range   Sodium 137  135 - 145 mEq/L   Potassium 3.8  3.5 - 5.1 mEq/L   Chloride 103  96 - 112 mEq/L   CO2 25  19 - 32 mEq/L   Glucose, Bld 79  70 - 99 mg/dL   BUN 10  6 - 23 mg/dL   Creatinine, Ser 0.86  0.50 - 1.10 mg/dL   Calcium 9.6  8.4 - 57.8 mg/dL   GFR calc non Af Amer >90  >90 mL/min   GFR calc Af Amer >90  >90 mL/min  D-DIMER, QUANTITATIVE      Component Value Range   D-Dimer, Quant 0.27  0.00 - 0.48 ug/mL-FEU  POCT I-STAT TROPONIN I      Component Value Range   Troponin i, poc 0.00  0.00 - 0.08 ng/mL   Comment 3            Dg Chest 2 View  03/29/2012  *RADIOLOGY REPORT*  Clinical Data: Chest pain  CHEST - 2 VIEW  Comparison:  None.  Findings:  The heart size and mediastinal contours are within normal limits.  Both lungs are clear.  The visualized skeletal structures are unremarkable.  IMPRESSION: No active cardiopulmonary disease.   Original Report Authenticated By: Myles Rosenthal, M.D.     Patient has unremarkable lab tests including troponin and d-dimer as well as unremarkable chest x-ray and EKG. At this time doubt any concerning or emergent cause the symptoms. Symptoms were improved and relieved with ibuprofen at home. Patient will be instructed to continue ibuprofen followup with PCP.  Angus Seller, [place] 03/30/12 [phone]

## 2012-03-30 NOTE — ED Provider Notes (Signed)
Medical screening examination/treatment/procedure(s) were performed by non-physician practitioner and as supervising physician I was immediately available for consultation/collaboration.  Gregori Abril R. Ruthann Angulo, MD 03/30/12 2347 

## 2012-04-04 ENCOUNTER — Telehealth: Payer: Self-pay | Admitting: Family Medicine

## 2012-04-04 MED ORDER — ACYCLOVIR 400 MG PO TABS: 400.0000 mg | ORAL_TABLET | Freq: Three times a day (TID) | ORAL | Status: DC

## 2012-04-04 NOTE — Telephone Encounter (Signed)
Pt informed. Jill Rush  

## 2012-04-04 NOTE — Telephone Encounter (Signed)
Pt is in need of acyclovir 400mg  on [facility] on [address]. Pls call when called in

## 2012-04-04 NOTE — Telephone Encounter (Signed)
Please let her know I sent it in  Thanks  LC  

## 2012-04-12 ENCOUNTER — Other Ambulatory Visit: Payer: Self-pay

## 2012-05-13 ENCOUNTER — Emergency Department (HOSPITAL_COMMUNITY)
Admission: EM | Admit: 2012-05-13 | Discharge: 2012-05-13 | Disposition: A | Discharge status: Home / Self Care | Payer: Self-pay | Source: Home / Self Care

## 2012-05-13 ENCOUNTER — Encounter (HOSPITAL_COMMUNITY): Payer: Self-pay | Admitting: *Deleted

## 2012-05-13 DIAGNOSIS — J329 Chronic sinusitis, unspecified: Secondary | ICD-10-CM

## 2012-05-13 DIAGNOSIS — J111 Influenza due to unidentified influenza virus with other respiratory manifestations: Secondary | ICD-10-CM

## 2012-05-13 MED ORDER — PROMETHAZINE HCL 50 MG PO TABS: 25.0000 mg | ORAL_TABLET | Freq: Four times a day (QID) | ORAL | Status: DC | PRN

## 2012-05-13 MED ORDER — AZITHROMYCIN 250 MG PO TABS: ORAL_TABLET | ORAL | Status: DC

## 2012-05-13 NOTE — ED Provider Notes (Signed)
History     CSN: [id number]  Arrival date & time 05/13/12  1329   First MD Initiated Contact with Patient 05/13/12 1331      Chief Complaint  Patient presents with  . URI  . Emesis    (Consider location/radiation/quality/duration/timing/severity/associated sxs/prior treatment) HPI Patient 1 who presents with main concern of two-day history of progressively worsening generalized weakness, nonbloody vomiting, nonbloody diarrhea, associated subjective fevers and chills and sinus type of headaches. Patient describes the headaches as mostly located on the front of her face and around the nose, associated with nasal congestion and with no specific aggravating or alleviating factors. Patient denies similar events in the past. Patient denies recent sicknesses or hospitalizations, patient denies recent sick contacts or exposures. Patient explains she also has generalized abdominal discomfort, worse with vomiting, located mostly in epigastric area, nonradiating, 5/10 in severity when present, no specific alleviating factors. Past Medical History  Diagnosis Date  . No pertinent past medical history     History reviewed. No pertinent past surgical history.  Family History  Problem Relation Age of Onset  . Hypertension Mother   . Diabetes Sister     with pregnancy    History  Substance Use Topics  . Smoking status: Never Smoker   . Smokeless tobacco: Never Used  . Alcohol Use: 0.5 oz/week    1 drink(s) per week    OB History   Grav Para Term Preterm Abortions TAB SAB Ect Mult Living   1    1 1           Review of Systems  Constitutional: Positive for fever, chills, negative for diaphoresis, activity change, appetite change and fatigue.  HENT: Negative for ear pain, nosebleeds, congestion, facial swelling, rhinorrhea, neck pain, neck stiffness and ear discharge.   Eyes: Negative for pain, discharge, redness, itching and visual disturbance.  Respiratory: Negative for cough,  choking, chest tightness, shortness of breath, wheezing and stridor.   Cardiovascular: Negative for chest pain, palpitations and leg swelling.  Gastrointestinal: Negative for abdominal distention.  Genitourinary: Negative for dysuria, urgency, frequency, hematuria, flank pain, decreased urine volume, difficulty urinating and dyspareunia.  Musculoskeletal: Negative for back pain, joint swelling, arthralgias and gait problem.  Neurological: Negative for dizziness, tremors, seizures, syncope, facial asymmetry, speech difficulty, weakness, light-headedness, numbness and headaches.  Hematological: Negative for adenopathy. Does not bruise/bleed easily.  Psychiatric/Behavioral: Negative for hallucinations, behavioral problems, confusion, dysphoric mood, decreased concentration and agitation.    Allergies  Review of patient's allergies indicates no known allergies.  Home Medications   Current Outpatient Rx  Name  Route  Sig  Dispense  Refill  . acetaminophen (TYLENOL) 500 MG tablet   Oral   Take 1,000 mg by mouth every 6 (six) hours as needed. For pain         . acyclovir (ZOVIRAX) 400 MG tablet   Oral   Take 1 tablet (400 mg total) by mouth 3 (three) times daily.   20 tablet   1   . azithromycin (ZITHROMAX) 250 MG tablet      Take  As prescribed   6 each   0   . promethazine (PHENERGAN) 50 MG tablet   Oral   Take 0.5 tablets (25 mg total) by mouth every 6 (six) hours as needed for nausea.   45 tablet   0     BP 98/67  Pulse 110  Temp(Src) 98.2 F (36.8 C) (Oral)  Resp 18  SpO2 98%  LMP 05/06/2012  Physical  Exam  Constitutional: Appears well-developed and well-nourished. No distress.  HENT: Normocephalic. External right and left ear normal. Oropharynx is clear and moist. Sinuses maxillary and frontal bilateral tenderness Eyes: Conjunctivae and EOM are normal. PERRLA, no scleral icterus.  Neck: Normal ROM. Neck supple. No JVD. No tracheal deviation. No thyromegaly.   CVS: Regular rhythm, tachycardic, S1/S2 +, no murmurs, no gallops, no carotid bruit.  Pulmonary: Effort and breath sounds normal, no stridor, rhonchi, wheezes, rales.  Abdominal: Soft. BS +,  no distension, tenderness in epigastric area, no rebound or guarding.  Musculoskeletal: Normal range of motion. No edema and no tenderness.  Lymphadenopathy: No lymphadenopathy noted, cervical, inguinal. Neuro: Alert. Normal reflexes, muscle tone coordination. No cranial nerve deficit. Skin: Skin is warm and dry. No rash noted. Not diaphoretic. No erythema. No pallor.  Psychiatric: Normal mood and affect. Behavior, judgment, thought content normal.    ED Course  Procedures (including critical care time)  Labs Reviewed - No data to display No results found.   1. Flu - clinical exam findings and symptoms mostly consistent with viral gastroenteritis, Arna Medici virus discussed as potential diagnoses. Patient advised to drink plenty of fluids. Will prescribe Phenergan for symptomatic relief of nausea and vomiting. Patient advised if her symptoms are not better she needs to go to emergency department.   2. Sinusitis - significant tenderness on exam noted in maxillary and frontal sinuses and given duration of symptoms will treat with course of antibiotics.       MDM  Viral gastroenteritis        Dorothea Ogle, MD 05/13/12 1504

## 2012-05-13 NOTE — ED Notes (Signed)
Pt reports cold symptoms and two episodes of vomiting that started last night - pt also needs to establish care

## 2012-06-03 ENCOUNTER — Encounter: Payer: Self-pay | Admitting: Family Medicine

## 2012-06-03 ENCOUNTER — Ambulatory Visit (INDEPENDENT_AMBULATORY_CARE_PROVIDER_SITE_OTHER): Payer: Medicaid Other | Admitting: Family Medicine

## 2012-06-03 VITALS — BP 116/79 | HR 69 | Temp 98.1°F | Wt 152.0 lb

## 2012-06-03 DIAGNOSIS — Z309 Encounter for contraceptive management, unspecified: Secondary | ICD-10-CM

## 2012-06-03 DIAGNOSIS — Z3009 Encounter for other general counseling and advice on contraception: Secondary | ICD-10-CM

## 2012-06-03 DIAGNOSIS — L259 Unspecified contact dermatitis, unspecified cause: Secondary | ICD-10-CM

## 2012-06-03 DIAGNOSIS — Z30011 Encounter for initial prescription of contraceptive pills: Secondary | ICD-10-CM

## 2012-06-03 DIAGNOSIS — L309 Dermatitis, unspecified: Secondary | ICD-10-CM

## 2012-06-03 LAB — POCT URINE PREGNANCY: Preg Test, Ur: NEGATIVE

## 2012-06-03 MED ORDER — HYDROCORTISONE VALERATE 0.2 % EX OINT: TOPICAL_OINTMENT | Freq: Two times a day (BID) | CUTANEOUS | Status: DC

## 2012-06-03 MED ORDER — ETONOGESTREL-ETHINYL ESTRADIOL 0.12-0.015 MG/24HR VA RING: VAGINAL_RING | VAGINAL | Status: DC

## 2012-06-03 NOTE — Progress Notes (Signed)
Subjective:     Patient ID: Jill Rush, female   DOB: [birth year], 24 y.o.   MRN: [id number]  HPI Birth Control:Here for birth control counseling and initiation,here LMP: 05/05/12,period for the most part has been regular,she has period for 5 days. Last OCP was about 4 months ago,she uses condom regular to prevent pregnancy.She used Depo in the past with s/e (headache).No recent STI or UTI. Rash: C/O itchy rash which started over her left arm and now she has rash on her gluteal area.The rash on her arm is clearing.She has been scratching rash on the gluteus a lot now it is painful.She denies insect bite,no change in diet or medication,no fever.She had similar rash in the past and was told she had hives.  Past Medical History  Diagnosis Date  . No pertinent past medical history       Review of Systems  Respiratory: Negative.   Cardiovascular: Negative.   Gastrointestinal: Negative.   Genitourinary: Negative for vaginal discharge and menstrual problem.  Skin: Positive for rash.  All other systems reviewed and are negative.       Filed Vitals:   06/03/12 1056  BP: 116/79  Pulse: 69  Temp: 98.1 F (36.7 C)  TempSrc: Oral  Weight: 152 lb (68.947 kg)    Objective:   Physical Exam  Nursing note and vitals reviewed. Constitutional: She is oriented to person, place, and time. She appears well-developed. No distress.  Cardiovascular: Normal rate, regular rhythm and normal heart sounds.   No murmur heard. Pulmonary/Chest: Effort normal and breath sounds normal. No respiratory distress. She has no wheezes. She exhibits no tenderness.  Abdominal: Soft. Bowel sounds are normal. She exhibits no distension and no mass. There is no tenderness.  Musculoskeletal: Normal range of motion. She exhibits no edema.  Neurological: She is alert and oriented to person, place, and time.  Skin:          Assessment:     Birth control counseling and initiation Dermatitis: Etiology  unclear,possible contact dermatitis.    Plan:     1. I counseled patient on birth control and gave her various options,she prefer Nuva ring.    S/E and use discussed with patient.    Prescription was given to her.    Pregnancy test done today is negative.  2. Westcort topical prescribed. RTC if no improvement.

## 2012-06-03 NOTE — Patient Instructions (Addendum)

## 2012-06-03 NOTE — Assessment & Plan Note (Signed)
  Dermatitis: Etiology unclear,possible contact dermatitis.  2. Westcort topical prescribed. RTC if no improvement.

## 2012-06-03 NOTE — Assessment & Plan Note (Signed)
  Birth control counseling and initiation  1. I counseled patient on birth control and gave her various options,she prefer Nuva ring.    S/E and use discussed with patient.    Prescription was given to her.    Pregnancy test done today is negative.

## 2012-06-14 ENCOUNTER — Other Ambulatory Visit: Payer: Self-pay | Admitting: Family Medicine

## 2012-06-16 ENCOUNTER — Encounter: Payer: Self-pay | Admitting: *Deleted

## 2012-06-16 ENCOUNTER — Telehealth: Payer: Self-pay | Admitting: *Deleted

## 2012-06-16 MED ORDER — ACYCLOVIR 400 MG PO TABS: ORAL_TABLET | ORAL | Status: DC

## 2012-06-16 NOTE — Telephone Encounter (Signed)
Received prior authorization from [facility] for acyclovir due to patient exceeding quantity limit.  Patient has Medicaid.  Contacted pharmacy and they are unsure of quantity allowed.  Will have to complete PA form.  Form placed in Dr. Cyndia Skeeters box in PCP's absence.

## 2012-06-16 NOTE — Telephone Encounter (Signed)
Typical dosing for recurrent herpes infection is Acyclovir 400 mg 3 times per day for 5 days.  The prescription problem is tagged with exceeds quantity and/or day supply.  I will try resending the Rx for a five day supply.

## 2012-06-17 NOTE — Telephone Encounter (Signed)
Spoke with [employer] and informed of quantity change.  They will try to process Rx, but patient may have to pay out of pocket due to having "pregnancy Medicaid."  Senaida Ores, Maryjean Ka, RN

## 2012-07-30 ENCOUNTER — Encounter (HOSPITAL_COMMUNITY): Payer: Self-pay | Admitting: *Deleted

## 2012-07-30 ENCOUNTER — Emergency Department (HOSPITAL_COMMUNITY)
Admission: EM | Admit: 2012-07-30 | Discharge: 2012-07-30 | Disposition: A | Discharge status: Home / Self Care | Payer: Self-pay | Attending: Emergency Medicine | Admitting: Emergency Medicine

## 2012-07-30 DIAGNOSIS — K089 Disorder of teeth and supporting structures, unspecified: Secondary | ICD-10-CM | POA: Insufficient documentation

## 2012-07-30 DIAGNOSIS — K0889 Other specified disorders of teeth and supporting structures: Secondary | ICD-10-CM

## 2012-07-30 MED ORDER — IBUPROFEN 800 MG PO TABS
800.0000 mg | ORAL_TABLET | Freq: Once | ORAL | Status: AC
  Administered 2012-07-30: 800 mg via ORAL
  Filled 2012-07-30: qty 1

## 2012-07-30 MED ORDER — PENICILLIN V POTASSIUM 500 MG PO TABS: 500.0000 mg | ORAL_TABLET | Freq: Three times a day (TID) | ORAL | Status: DC

## 2012-07-30 MED ORDER — IBUPROFEN 800 MG PO TABS: 800.0000 mg | ORAL_TABLET | Freq: Three times a day (TID) | ORAL | Status: DC

## 2012-07-30 NOTE — ED Notes (Signed)
Pt c/o top/bottom right sided jaw/toothache x 1 wk

## 2012-07-30 NOTE — ED Provider Notes (Signed)
History     CSN: [id number]  Arrival date & time 07/30/12  0458   First MD Initiated Contact with Patient 07/30/12 [phone]      Chief Complaint  Patient presents with  . Dental Pain    (Consider location/radiation/quality/duration/timing/severity/associated sxs/prior treatment) HPI  22 year old female presents complaining of dental pain. Patient reports intermittent sharp achy, nonradiating pain to her right lower second molar. Pain is worsened with cold air. pain has been ongoing for the past week but worse since last night. No fever, throat swelling, trouble swallowing, jaw pain, or rash. No recent trauma. No specific treatment tried. Does not have a dentist. Has had pain to the same tooth in the past.  Past Medical History  Diagnosis Date  . No pertinent past medical history     History reviewed. No pertinent past surgical history.  Family History  Problem Relation Age of Onset  . Hypertension Mother   . Diabetes Sister     with pregnancy    History  Substance Use Topics  . Smoking status: Never Smoker   . Smokeless tobacco: Never Used  . Alcohol Use: 0.5 oz/week    1 drink(s) per week    OB History   Grav Para Term Preterm Abortions TAB SAB Ect Mult Living   1    1 1           Review of Systems  Constitutional: Negative for fever.  HENT: Positive for dental problem.   Neurological: Negative for headaches.    Allergies  Review of patient's allergies indicates no known allergies.  Home Medications   Current Outpatient Rx  Name  Route  Sig  Dispense  Refill  . acetaminophen (TYLENOL) 500 MG tablet   Oral   Take 1,000 mg by mouth every 6 (six) hours as needed. For pain         . acyclovir (ZOVIRAX) 400 MG tablet      take 1 tablet by mouth three times a day   15 tablet   1   . etonogestrel-ethinyl estradiol (NUVARING) 0.12-0.015 MG/24HR vaginal ring      Insert vaginally and leave in place for 3 consecutive weeks, then remove for 1 week. Start on  or before 5 days of menstrual cycle.Use condom at least for the first 5 days of use.   1 each   4   . Multiple Vitamin (MULTIVITAMIN WITH MINERALS) TABS   Oral   Take 1 tablet by mouth daily.           BP 108/64  Pulse 88  Temp(Src) 98.7 F (37.1 C) (Oral)  Resp 20  SpO2 99%  LMP 07/12/2012  Physical Exam  Nursing note and vitals reviewed. Constitutional: She appears well-developed and well-nourished. No distress.  HENT:  Head: Normocephalic and atraumatic.  Mouth/Throat:    Eyes: Conjunctivae are normal.  Neck: Neck supple.  Neurological: She is alert.  Skin: No rash noted.    ED Course  Procedures (including critical care time)  6:15 AM Patient presents with pain to her right lower second molar. Some evidence of dental decay noted. No trismus. No airway compromise. Likely a periapical abscess. We'll discharge with pain medication, antibiotic, and primary for her to a dentist.  Labs Reviewed - No data to display No results found.   1. Pain, dental       MDM  BP 108/64  Pulse 88  Temp(Src) 98.7 F (37.1 C) (Oral)  Resp 20  SpO2 99%  LMP 07/12/2012  Fayrene Helper, PA-C 07/30/12 [id number]

## 2012-07-31 NOTE — ED Provider Notes (Signed)
Medical screening examination/treatment/procedure(s) were performed by non-physician practitioner and as supervising physician I was immediately available for consultation/collaboration.  Scottlyn Mchaney, MD 07/31/12 0533 

## 2012-10-16 ENCOUNTER — Encounter: Payer: Self-pay | Admitting: Family Medicine

## 2012-10-16 ENCOUNTER — Ambulatory Visit (INDEPENDENT_AMBULATORY_CARE_PROVIDER_SITE_OTHER): Payer: Medicaid Other | Admitting: Family Medicine

## 2012-10-16 VITALS — BP 105/70 | HR 62 | Temp 98.3°F | Ht 59.5 in | Wt 150.0 lb

## 2012-10-16 DIAGNOSIS — T192XXA Foreign body in vulva and vagina, initial encounter: Secondary | ICD-10-CM | POA: Insufficient documentation

## 2012-10-16 DIAGNOSIS — R109 Unspecified abdominal pain: Secondary | ICD-10-CM

## 2012-10-16 NOTE — Assessment & Plan Note (Signed)
Condom removed 

## 2012-10-16 NOTE — Progress Notes (Signed)
Subjective:     Patient ID: Jill Rush, female   DOB: 02-04-1991, 22 y.o.   MRN: [id number]  HPI Patient presents for same day visit with condom in vagina x 2 days. Patient and her boyfriend unable to retrieve condom.  Initially associated with abdominal pain, N/V or fever. No abdominal pain now. No vaginal itching or discharge.   Review of Systems As per HPI     Objective:   Physical Exam BP 105/70  Pulse 62  Temp(Src) 98.3 F (36.8 C) (Oral)  Ht 4' 11.5" (1.511 m)  Wt 150 lb (68.04 kg)  BMI 29.8 kg/m2  LMP 09/30/2012 General appearance: alert, cooperative and no distress Abdomen: soft, non-tender; bowel sounds normal; no masses,  no organomegaly Pelvic: External: normal with thick white vaginal discharge noted. Vagina: purple condom visualized with speculum, more thick white  Vaginal discharge. Condom retrieved with ring forceps. Cervix visualized and normal. Bimanual: no CMT, no uterine mass or tenderness, no adnexal mass or tenderness.     Assessment and Plan:

## 2012-10-16 NOTE — Patient Instructions (Addendum)
Flor,  Thank you for coming in today. I removed the condom. Please continue to use condoms with sex. I do also recommend using another form of birth control as well.  Dr. Armen Pickup

## 2012-12-05 ENCOUNTER — Ambulatory Visit: Payer: Medicaid Other | Admitting: Family Medicine

## 2012-12-08 ENCOUNTER — Ambulatory Visit: Payer: Medicaid Other | Admitting: Family Medicine

## 2012-12-29 ENCOUNTER — Encounter: Payer: Self-pay | Admitting: Family Medicine

## 2012-12-29 ENCOUNTER — Ambulatory Visit (INDEPENDENT_AMBULATORY_CARE_PROVIDER_SITE_OTHER): Payer: Medicaid Other | Admitting: Family Medicine

## 2012-12-29 VITALS — BP 116/74 | HR 92 | Temp 98.2°F | Wt 153.0 lb

## 2012-12-29 DIAGNOSIS — L0231 Cutaneous abscess of buttock: Secondary | ICD-10-CM

## 2012-12-29 NOTE — Assessment & Plan Note (Signed)
A: R gluteal abscess with induration, mild fluctuance. TTP. No fever or chills.  P: Patient to go home and try sitz baths Tylenol and motrin prn pain' Schedule f/u in 3-4 days for re-eval with plan for I&D if no improvement.  Advised f/u sooner if symptoms worsen.

## 2012-12-29 NOTE — Patient Instructions (Signed)
Jill Rush,  Thank you for coming in today. You have a R gluteal (butt cheek) abscess.   For this please do the following: Sitz baths: warm water and epsom salt 1-2 times per day' Tylenol or motrin for pain Schedule follow up in 3-4 days for re-evaluation.   Call and f/u sooner if you develop fever, chills, worsening pain or spreading pain.  Dr. Armen Pickup   Abscess An abscess (boil or furuncle) is an infected area on or under the skin. This area is filled with yellowish-white fluid (pus) and other material (debris). HOME CARE   Only take medicines as told by your doctor.  If you were given antibiotic medicine, take it as directed. Finish the medicine even if you start to feel better.  If gauze is used, follow your doctor's directions for changing the gauze.  To avoid spreading the infection:  Keep your abscess covered with a bandage.  Wash your hands well.  Do not share personal care items, towels, or whirlpools with others.  Avoid skin contact with others.  Keep your skin and clothes clean around the abscess.  Keep all doctor visits as told. GET HELP RIGHT AWAY IF:   You have more pain, puffiness (swelling), or redness in the wound site.  You have more fluid or blood coming from the wound site.  You have muscle aches, chills, or you feel sick.  You have a fever. MAKE SURE YOU:   Understand these instructions.  Will watch your condition.  Will get help right away if you are not doing well or get worse. Document Released: 08/01/2007 Document Revised: 08/14/2011 Document Reviewed: 04/27/2011 [facility] Patient Information 2014 [place], [place].

## 2012-12-29 NOTE — Progress Notes (Signed)
  Subjective:    Patient ID: Jill Rush, female    DOB: 1991-01-02, 22 y.o.   MRN: [id number]  HPI 22 yo F presents for same day visit to discuss the following:  1. Gluteal pain (? Hemorrhoids): Patient presents with 2 weeks of right gluteal pain with sitting, lying and touching the area. Pain is ascribed as a severe soreness. Patient has had similar symptoms previously requiring I&D. She denies rectal bleeding and fecal incontinence. She also denies fever, chills, night sweats. She has tried no treatment, specifically no sitz baths. No recent antibiotic use.    Review of Systems As per HPI     Objective:   Physical Exam BP 116/74  Pulse 92  Temp(Src) 98.2 F (36.8 C) (Oral)  Wt 153 lb (69.4 kg)  LMP 12/22/2012 General appearance: alert, cooperative and no distress Skin: R upper medial (into cleft) gluteal: skin dimpling and TTP with lateral area of induration and central area of fluctuance. No papules or pustules. No open areas of drainage. very minimal erythema and warmth.      Assessment & Plan:

## 2013-01-01 ENCOUNTER — Other Ambulatory Visit: Payer: Self-pay

## 2013-01-02 ENCOUNTER — Ambulatory Visit: Payer: Medicaid Other | Admitting: Family Medicine

## 2013-04-01 ENCOUNTER — Ambulatory Visit: Payer: Medicaid Other | Admitting: Family Medicine

## 2013-04-02 ENCOUNTER — Ambulatory Visit (INDEPENDENT_AMBULATORY_CARE_PROVIDER_SITE_OTHER): Payer: Medicaid Other | Admitting: Family Medicine

## 2013-04-02 ENCOUNTER — Encounter: Payer: Self-pay | Admitting: Family Medicine

## 2013-04-02 VITALS — BP 117/74 | HR 84 | Temp 97.8°F | Ht 59.5 in | Wt 149.0 lb

## 2013-04-02 DIAGNOSIS — R6889 Other general symptoms and signs: Secondary | ICD-10-CM | POA: Insufficient documentation

## 2013-04-02 DIAGNOSIS — J029 Acute pharyngitis, unspecified: Secondary | ICD-10-CM

## 2013-04-02 LAB — POCT RAPID STREP A (OFFICE): RAPID STREP A SCREEN: NEGATIVE

## 2013-04-02 MED ORDER — SALINE SPRAY 0.65 % NA SOLN: 1.0000 | NASAL | Status: DC | PRN

## 2013-04-02 MED ORDER — OSELTAMIVIR PHOSPHATE 75 MG PO CAPS: 75.0000 mg | ORAL_CAPSULE | Freq: Two times a day (BID) | ORAL | Status: AC

## 2013-04-02 NOTE — Progress Notes (Signed)
   Subjective:    Patient ID: Jill Rush, female    DOB: 12-09-1990, 23 y.o.   MRN: [id number]  HPI 23 yo F presents for SD visit:  1. Cold symptoms: started 3 days ago with body aches and pain in legs. Progressed to HA, earaches, sore throat, non productive cough, nausea and emesis x one yesterday. Subjective fever. No known sick contacts. No CP, SOB. Today mostly with sore throat.   Soc Hx: non smoker  Review of Systems As per HPI     Objective:   Physical Exam BP 117/74  Pulse 84  Temp(Src) 97.8 F (36.6 C) (Oral)  Ht 4' 11.5" (1.511 m)  Wt 149 lb (67.586 kg)  BMI 29.60 kg/m2 General appearance: alert, cooperative and no distress Head: Normocephalic, without obvious abnormality, atraumatic Eyes: conjunctivae/corneas clear. PERRL, EOM's intact. Ears: abnormal external canal right ear - cerumen removed by irrigation and abnormal external canal left ear - cerumen removed by irrigation. Normal TM on L. Air fluid levels behind TM on R  Nose: no discharge, mild congestion, turbinates pink, swollen Throat: normal findings: lips normal without lesions, buccal mucosa normal, palate normal and tongue midline and normal and abnormal findings: tonsillar hypertrophy 2+ on the R w/o exudate. No oropharyngeal erythema  Neck: no adenopathy, no carotid bruit, no JVD, supple, symmetrical, trachea midline and thyroid not enlarged, symmetric, no tenderness/mass/nodules Lungs: clear to auscultation bilaterally Heart: regular rate and rhythm, S1, S2 normal, no murmur, click, rub or gallop Abdomen: soft, non-tender; bowel sounds normal; no masses,  no organomegaly     Assessment & Plan:

## 2013-04-02 NOTE — Patient Instructions (Signed)
Ms. Cheree DittoGraham,  Thank you for coming in today. Your symptoms are consistent with the flu.   For this please do the following:  1. Start tamiflu one pill twice daily for 5 days 2. Alternate tylenol (500 mg) and motrin (400 mg) every 4-6 hrs as needed for sore throat  3. Start nasal saline for nasal congestion.  Please return if you develop high fever, productive cough, chest pain or SOB.   Dr. Armen PickupFunches

## 2013-04-02 NOTE — Assessment & Plan Note (Signed)
Your symptoms are consistent with the flu.   For this please do the following:  1. Start tamiflu one pill twice daily for 5 days 2. Alternate tylenol (500 mg) and motrin (400 mg) every 4-6 hrs as needed for sore throat  3. Start nasal saline for nasal congestion.  Please return if you develop high fever, productive cough, chest pain or SOB.

## 2013-04-22 ENCOUNTER — Telehealth: Payer: Self-pay | Admitting: Family Medicine

## 2013-04-22 NOTE — Telephone Encounter (Signed)
[facility] [address]

## 2013-04-22 NOTE — Telephone Encounter (Signed)
Will forward to MD but pt may need to come in and be seen. Jazmin Hartsell,CMA

## 2013-04-24 MED ORDER — ACYCLOVIR 400 MG PO TABS: ORAL_TABLET | ORAL | Status: DC

## 2013-07-15 ENCOUNTER — Telehealth: Payer: Self-pay | Admitting: Family Medicine

## 2013-07-15 NOTE — Telephone Encounter (Signed)
Needs refill on acyclovir

## 2013-07-16 MED ORDER — ACYCLOVIR 400 MG PO TABS
ORAL_TABLET | ORAL | Status: DC
Start: 2013-07-16 — End: 2014-05-15

## 2013-07-16 NOTE — Telephone Encounter (Signed)
Done

## 2013-09-23 ENCOUNTER — Ambulatory Visit (INDEPENDENT_AMBULATORY_CARE_PROVIDER_SITE_OTHER): Payer: Self-pay | Admitting: Family Medicine

## 2013-09-23 ENCOUNTER — Encounter: Payer: Self-pay | Admitting: Family Medicine

## 2013-09-23 ENCOUNTER — Other Ambulatory Visit (HOSPITAL_COMMUNITY)
Admission: RE | Admit: 2013-09-23 | Discharge: 2013-09-23 | Disposition: A | Discharge status: Home / Self Care | Payer: Medicaid Other | Source: Ambulatory Visit | Attending: Family Medicine | Admitting: Family Medicine

## 2013-09-23 VITALS — BP 110/71 | HR 78 | Temp 98.3°F | Wt 152.3 lb

## 2013-09-23 DIAGNOSIS — Z202 Contact with and (suspected) exposure to infections with a predominantly sexual mode of transmission: Secondary | ICD-10-CM

## 2013-09-23 DIAGNOSIS — Z113 Encounter for screening for infections with a predominantly sexual mode of transmission: Secondary | ICD-10-CM | POA: Insufficient documentation

## 2013-09-23 DIAGNOSIS — N898 Other specified noninflammatory disorders of vagina: Secondary | ICD-10-CM

## 2013-09-23 DIAGNOSIS — N760 Acute vaginitis: Secondary | ICD-10-CM

## 2013-09-23 DIAGNOSIS — A499 Bacterial infection, unspecified: Secondary | ICD-10-CM

## 2013-09-23 DIAGNOSIS — B9689 Other specified bacterial agents as the cause of diseases classified elsewhere: Secondary | ICD-10-CM

## 2013-09-23 MED ORDER — METRONIDAZOLE 500 MG PO TABS: 500.0000 mg | ORAL_TABLET | Freq: Three times a day (TID) | ORAL | Status: DC

## 2013-09-23 MED ORDER — FLUCONAZOLE 150 MG PO TABS: 150.0000 mg | ORAL_TABLET | Freq: Once | ORAL | Status: DC

## 2013-09-23 NOTE — Progress Notes (Signed)
   Subjective:    Patient ID: Jill InchDominique M Hope, female    DOB: 12-12-1990, 23 y.o.   MRN: [id number]  HPI: Pt presents to clinic for SDA for vaginal discharge, present for about 1 week. Discharge does not have any particular color and is described as "thin" without much odor. She endorses some itching and some off-and-on, mild abdominal cramping but denies dysuria. She states no change in urinary habits such as different urge / frequency sensation and denies change in the timing / amount of her urinating. She is sexually active with one female partner, her last intercourse was about two weeks ago; they sometimes use protection (not the most recent time). Her last menses was last week. She has taken no medication for her current symptoms. She does have a history of genital herpes, but reports she has had no recent outbreaks and reports compliance with Zovirax.  Review of Systems: As above. She denies fever / chills, N / V, or change in bowel habits. She denies rashes or other new symptoms.     Objective:   Physical Exam BP 110/71  Pulse 78  Temp(Src) 98.3 F (36.8 C) (Oral)  Wt 152 lb 4.8 oz (69.083 kg)  LMP 09/15/2013 Gen: well-appearing adult female in NAD HEENT: [place]/AT, EOMI, MMM Cardio: RRR, no murmur Pulm: CTAB, no wheezes Abd: soft, nontender, BS+ GU: Speculum exam: normal external vaginal / vulvar structures without rashes or other lesions   Moderate amount of thin, grayish discharge present in vaginal vault   No thick, whitish discharge to suggest yeast   Cervix incompletely visualized, but no frank ulcerations, bleeding/discharge, or friability  Bimanual exam: no CMT, no adnexal masses, no fundal tenderness     Assessment & Plan:  A: 22yo with vaginal discharge and possible exposure to STI; no red flags to suggest frank TOA or PID, etc - history and exam consistent with BV, though speculum exam findings were underwhelming  P: Empiric treatment for BV, Flagyl 500 mg TID for 7  days - provided Rx for Diflucan 150 mg one time with instructions not to fill or take unless symptoms are persistent after Flagyl - HIV and RPR blood sample drawn today; will f/u result as appropriate - counseled on red flags that would prompt immediate return to care (high fever, worse pain, bleeding / increased discharge, vomiting, etc) - f/u with PCP Dr. Deirdre Priesthambliss as needed, otherwise  Bobbye Mortonhristopher M Fadi Menter, MD PGY-3, [facility] 09/23/2013, 10:23 AM

## 2013-09-23 NOTE — Patient Instructions (Signed)
Thank you for coming in, today!  I do not think you have a yeast infection, but I do think you have bacterial vaginosis (BV). This is NOT a sexually transmitted infection -- it is an overgrowth of the normal vaginal bacteria.  The treatment I am prescribing is a medicine called metronidazole (Flagyl), 500 mg (one pill) three times a day, for 1 week. DO NOT drink alcohol while taking this medicine and for about 3 days after you finish it.  I will give you a prescription for a medicine called Diflucan (fluconazole), one 150 mg pill. This is for yeast -- if your symptoms are not better after taking the metronidazole, take this one pill one time.  I will call you or send you a letter with your other test results. I will send you a letter if everything is negative -- if you have not heard anything in several days, assume everything is negative and a letter is on the way.  If you have worse pain, discharge, vaginal bleeding, high fever over 101, vomiting, or bad abdominal pain, call or come back to the clinic or call 911. Come back to see Dr. Deirdre Priesthambliss as you need. Please feel free to call with any questions or concerns at any time, at [phone]. --Dr. Casper HarrisonStreet

## 2013-09-24 LAB — RPR

## 2013-09-24 LAB — HIV ANTIBODY (ROUTINE TESTING W REFLEX): HIV: NONREACTIVE

## 2013-09-25 ENCOUNTER — Telehealth: Payer: Self-pay | Admitting: Family Medicine

## 2013-09-25 NOTE — Telephone Encounter (Signed)
Called pt and left generic message stating that her recent lab tests were normal and that she can call for more details; HIV, RPR (syphilis), and GC/Chlamydia were all negative. She can follow up with Dr. Deirdre Priesthambliss as needed.  --CMS, 09/25/13, 11:47 AM

## 2013-09-28 ENCOUNTER — Encounter: Payer: Self-pay | Admitting: Family Medicine

## 2013-09-28 ENCOUNTER — Ambulatory Visit (INDEPENDENT_AMBULATORY_CARE_PROVIDER_SITE_OTHER): Payer: Self-pay | Admitting: Family Medicine

## 2013-09-28 VITALS — BP 110/62 | HR 76 | Temp 98.2°F | Ht 59.5 in | Wt 153.0 lb

## 2013-09-28 DIAGNOSIS — N390 Urinary tract infection, site not specified: Secondary | ICD-10-CM

## 2013-09-28 DIAGNOSIS — R300 Dysuria: Secondary | ICD-10-CM

## 2013-09-28 LAB — POCT URINALYSIS DIPSTICK
Blood, UA: NEGATIVE
GLUCOSE UA: NEGATIVE
NITRITE UA: NEGATIVE
PROTEIN UA: 30
SPEC GRAV UA: 1.025
UROBILINOGEN UA: 2
pH, UA: 7

## 2013-09-28 LAB — POCT UA - MICROSCOPIC ONLY

## 2013-09-28 MED ORDER — PHENAZOPYRIDINE HCL 200 MG PO TABS: 200.0000 mg | ORAL_TABLET | Freq: Three times a day (TID) | ORAL | Status: DC | PRN

## 2013-09-28 MED ORDER — CEPHALEXIN 500 MG PO CAPS: 500.0000 mg | ORAL_CAPSULE | Freq: Two times a day (BID) | ORAL | Status: DC

## 2013-09-28 NOTE — Patient Instructions (Signed)
It was great seeing you today.   1. Take Keflex 1 pill twice a day for 7 days   If you have any questions or concerns before then, please call the clinic at [phone].  Take Care,   Dr Wenda LowJames Deryl Giroux

## 2013-09-29 DIAGNOSIS — N390 Urinary tract infection, site not specified: Secondary | ICD-10-CM | POA: Insufficient documentation

## 2013-09-29 NOTE — Progress Notes (Signed)
   Subjective:    Patient ID: Jill Rush, female    DOB: [birth year], 23 y.o.   MRN: [id number]  Dysuria  This is a new problem. The current episode started yesterday. The problem occurs every urination. The problem has been unchanged. The quality of the pain is described as burning. The pain is moderate. There has been no fever. She is sexually active. There is no history of pyelonephritis. Pertinent negatives include no chills, discharge, flank pain, nausea, sweats or vomiting. She has tried nothing for the symptoms. Reports it feels similar to previous UTIs      Review of Systems  Constitutional: Negative for chills.  Gastrointestinal: Negative for nausea and vomiting.  Genitourinary: Positive for dysuria. Negative for flank pain.       Objective:   Physical Exam  Constitutional: She appears well-developed and well-nourished.  Cardiovascular: Normal rate and regular rhythm.   Pulmonary/Chest: Effort normal and breath sounds normal.  Genitourinary:  No CVA tenderness    Assessment/Plan:      See Problem Focused Assessment & Plan

## 2013-09-29 NOTE — Assessment & Plan Note (Signed)
Keflex x 7 days Pyridium x 2 days PRN

## 2013-10-01 ENCOUNTER — Telehealth: Payer: Self-pay | Admitting: Family Medicine

## 2013-10-01 ENCOUNTER — Other Ambulatory Visit: Payer: Self-pay | Admitting: Family Medicine

## 2013-10-01 MED ORDER — FLUCONAZOLE 150 MG PO TABS: 150.0000 mg | ORAL_TABLET | Freq: Once | ORAL | Status: DC

## 2013-10-01 NOTE — Telephone Encounter (Signed)
PT was seen on 8/3 by Gayla DossJoyner and was told that if symptoms of yeast infection did not get better for her to call back and she would be prescribed Diflucan. Symptoms no better, requesting Rx for Diflucan. Please advise.

## 2013-10-01 NOTE — Progress Notes (Signed)
Called patient and advised her to take Diflucan previous sent to her pharmacy by Dr Casper HarrisonStreet. Call/Return to clinic if symptoms not improved 2-3 days after taking this.

## 2013-10-30 ENCOUNTER — Ambulatory Visit (INDEPENDENT_AMBULATORY_CARE_PROVIDER_SITE_OTHER): Payer: Medicaid Other | Admitting: Family Medicine

## 2013-10-30 ENCOUNTER — Encounter: Payer: Self-pay | Admitting: Family Medicine

## 2013-10-30 VITALS — BP 109/77 | HR 66 | Temp 98.4°F | Ht 59.5 in | Wt 155.0 lb

## 2013-10-30 DIAGNOSIS — N898 Other specified noninflammatory disorders of vagina: Secondary | ICD-10-CM

## 2013-10-30 DIAGNOSIS — B3731 Acute candidiasis of vulva and vagina: Secondary | ICD-10-CM

## 2013-10-30 DIAGNOSIS — B373 Candidiasis of vulva and vagina: Secondary | ICD-10-CM

## 2013-10-30 LAB — POCT WET PREP (WET MOUNT): CLUE CELLS WET PREP WHIFF POC: NEGATIVE

## 2013-10-30 MED ORDER — FLUCONAZOLE 150 MG PO TABS: 150.0000 mg | ORAL_TABLET | Freq: Once | ORAL | Status: DC

## 2013-10-30 NOTE — Patient Instructions (Addendum)
Take diflucan  once today, repeat in 3 days if not better. Return if not improving.  Come back to see Dr. Deirdre Priest to talk about birth control options.  Be well, Dr. Pollie Meyer    Monilial Vaginitis Vaginitis in a soreness, swelling and redness (inflammation) of the vagina and vulva. Monilial vaginitis is not a sexually transmitted infection. CAUSES  Yeast vaginitis is caused by yeast (candida) that is normally found in your vagina. With a yeast infection, the candida has overgrown in number to a point that upsets the chemical balance. SYMPTOMS   White, thick vaginal discharge.  Swelling, itching, redness and irritation of the vagina and possibly the lips of the vagina (vulva).  Burning or painful urination.  Painful intercourse. DIAGNOSIS  Things that may contribute to monilial vaginitis are:  Postmenopausal and virginal states.  Pregnancy.  Infections.  Being tired, sick or stressed, especially if you had monilial vaginitis in the past.  Diabetes. Good control will help lower the chance.  Birth control pills.  Tight fitting garments.  Using bubble bath, feminine sprays, douches or deodorant tampons.  Taking certain medications that kill germs (antibiotics).  Sporadic recurrence can occur if you become ill. TREATMENT  Your caregiver will give you medication.  There are several kinds of anti monilial vaginal creams and suppositories specific for monilial vaginitis. For recurrent yeast infections, use a suppository or cream in the vagina 2 times a week, or as directed.  Anti-monilial or steroid cream for the itching or irritation of the vulva may also be used. Get your caregiver's permission.  Painting the vagina with methylene blue solution may help if the monilial cream does not work.  Eating yogurt may help prevent monilial vaginitis. HOME CARE INSTRUCTIONS   Finish all medication as prescribed.  Do not have sex until treatment is completed or after  your caregiver tells you it is okay.  Take warm sitz baths.  Do not douche.  Do not use tampons, especially scented ones.  Wear cotton underwear.  Avoid tight pants and panty hose.  Tell your sexual partner that you have a yeast infection. They should go to their caregiver if they have symptoms such as mild rash or itching.  Your sexual partner should be treated as well if your infection is difficult to eliminate.  Practice safer sex. Use condoms.  Some vaginal medications cause latex condoms to fail. Vaginal medications that harm condoms are:  Cleocin cream.  Butoconazole (Femstat).  Terconazole (Terazol) vaginal suppository.  Miconazole (Monistat) (may be purchased over the counter). SEEK MEDICAL CARE IF:   You have a temperature by mouth above 102 F (38.9 C).  The infection is getting worse after 2 days of treatment.  The infection is not getting better after 3 days of treatment.  You develop blisters in or around your vagina.  You develop vaginal bleeding, and it is not your menstrual period.  You have pain when you urinate.  You develop intestinal problems.  You have pain with sexual intercourse. Document Released: 11/22/2004 Document Revised: 05/07/2011 Document Reviewed: 08/06/2008 [facility] Patient Information 2015 [place], [place]. This information is not intended to replace advice given to you by your health care provider. Make sure you discuss any questions you have with your health care provider.   Contraception Choices Contraception (birth control) is the use of any methods or devices to prevent pregnancy. Below are some methods to help avoid pregnancy. HORMONAL METHODS   Contraceptive implant. This is a thin, plastic tube containing progesterone hormone. It  does not contain estrogen hormone. Your health care provider inserts the tube in the inner part of the upper arm. The tube can remain in place for up to 3 years. After 3 years, the implant must  be removed. The implant prevents the ovaries from releasing an egg (ovulation), thickens the cervical mucus to prevent sperm from entering the uterus, and thins the lining of the inside of the uterus.  Progesterone-only injections. These injections are given every 3 months by your health care provider to prevent pregnancy. This synthetic progesterone hormone stops the ovaries from releasing eggs. It also thickens cervical mucus and changes the uterine lining. This makes it harder for sperm to survive in the uterus.  Birth control pills. These pills contain estrogen and progesterone hormone. They work by preventing the ovaries from releasing eggs (ovulation). They also cause the cervical mucus to thicken, preventing the sperm from entering the uterus. Birth control pills are prescribed by a health care provider.Birth control pills can also be used to treat heavy periods.  Minipill. This type of birth control pill contains only the progesterone hormone. They are taken every day of each month and must be prescribed by your health care provider.  Birth control patch. The patch contains hormones similar to those in birth control pills. It must be changed once a week and is prescribed by a health care provider.  Vaginal ring. The ring contains hormones similar to those in birth control pills. It is left in the vagina for 3 weeks, removed for 1 week, and then a new one is put back in place. The patient must be comfortable inserting and removing the ring from the vagina.A health care provider's prescription is necessary.  Emergency contraception. Emergency contraceptives prevent pregnancy after unprotected sexual intercourse. This pill can be taken right after sex or up to 5 days after unprotected sex. It is most effective the sooner you take the pills after having sexual intercourse. Most emergency contraceptive pills are available without a prescription. Check with your pharmacist. Do not use emergency  contraception as your only form of birth control. BARRIER METHODS   Female condom. This is a thin sheath (latex or rubber) that is worn over the penis during sexual intercourse. It can be used with spermicide to increase effectiveness.  Female condom. This is a soft, loose-fitting sheath that is put into the vagina before sexual intercourse.  Diaphragm. This is a soft, latex, dome-shaped barrier that must be fitted by a health care provider. It is inserted into the vagina, along with a spermicidal jelly. It is inserted before intercourse. The diaphragm should be left in the vagina for 6 to 8 hours after intercourse.  Cervical cap. This is a round, soft, latex or plastic cup that fits over the cervix and must be fitted by a health care provider. The cap can be left in place for up to 48 hours after intercourse.  Sponge. This is a soft, circular piece of polyurethane foam. The sponge has spermicide in it. It is inserted into the vagina after wetting it and before sexual intercourse.  Spermicides. These are chemicals that kill or block sperm from entering the cervix and uterus. They come in the form of creams, jellies, suppositories, foam, or tablets. They do not require a prescription. They are inserted into the vagina with an applicator before having sexual intercourse. The process must be repeated every time you have sexual intercourse. INTRAUTERINE CONTRACEPTION  Intrauterine device (IUD). This is a T-shaped device that is put  in a woman's uterus during a menstrual period to prevent pregnancy. There are 2 types:  Copper IUD. This type of IUD is wrapped in copper wire and is placed inside the uterus. Copper makes the uterus and fallopian tubes produce a fluid that kills sperm. It can stay in place for 10 years.  Hormone IUD. This type of IUD contains the hormone progestin (synthetic progesterone). The hormone thickens the cervical mucus and prevents sperm from entering the uterus, and it also thins  the uterine lining to prevent implantation of a fertilized egg. The hormone can weaken or kill the sperm that get into the uterus. It can stay in place for 3-5 years, depending on which type of IUD is used. PERMANENT METHODS OF CONTRACEPTION  Female tubal ligation. This is when the woman's fallopian tubes are surgically sealed, tied, or blocked to prevent the egg from traveling to the uterus.  Hysteroscopic sterilization. This involves placing a small coil or insert into each fallopian tube. Your doctor uses a technique called hysteroscopy to do the procedure. The device causes scar tissue to form. This results in permanent blockage of the fallopian tubes, so the sperm cannot fertilize the egg. It takes about 3 months after the procedure for the tubes to become blocked. You must use another form of birth control for these 3 months.  Female sterilization. This is when the female has the tubes that carry sperm tied off (vasectomy).This blocks sperm from entering the vagina during sexual intercourse. After the procedure, the man can still ejaculate fluid (semen). NATURAL PLANNING METHODS  Natural family planning. This is not having sexual intercourse or using a barrier method (condom, diaphragm, cervical cap) on days the woman could become pregnant.  Calendar method. This is keeping track of the length of each menstrual cycle and identifying when you are fertile.  Ovulation method. This is avoiding sexual intercourse during ovulation.  Symptothermal method. This is avoiding sexual intercourse during ovulation, using a thermometer and ovulation symptoms.  Post-ovulation method. This is timing sexual intercourse after you have ovulated. Regardless of which type or method of contraception you choose, it is important that you use condoms to protect against the transmission of sexually transmitted infections (STIs). Talk with your health care provider about which form of contraception is most appropriate for  you. Document Released: 02/12/2005 Document Revised: 02/17/2013 Document Reviewed: 08/07/2012 [facility] Patient Information 2015 [place], [place]. This information is not intended to replace advice given to you by your health care provider. Make sure you discuss any questions you have with your health care provider.

## 2013-10-30 NOTE — Progress Notes (Signed)
Patient ID: Jill Rush, female   DOB: 09/24/1990, 23 y.o.   MRN: [id number]  HPI:  Pt presents for a same day appointment to discuss vaginal itching.  She has itchy vaginal discharge. Painful. Used dial soap which spurred it on. Took abx for UTI about a month ago, also was treated for yeast infection with diflucan which cleared up the infection but has now returned. No pelvic pain. LMP August 28. Sexually active with 2 female partners in the last year. For birth control was on nuvaring but doesn't use anymore. Uses condoms every time.  ROS: See HPI  PMFSH: no significant pmhx  PHYSICAL EXAM: BP 109/77  Pulse 66  Temp(Src) 98.4 F (36.9 C) (Oral)  Ht 4' 11.5" (1.511 m)  Wt 155 lb (70.308 kg)  BMI 30.79 kg/m2 Gen: NAD HEENT: NCAT Lungs: normal respiratory effort Neuro: grossly nonfocal, speech normal GU: vulva without lesions or erythema. Thick white discharge present at vaginal introitus. Wet prep sample taken. Deferred bimanual or speculum exam to avoid pt discomfort as vulva tender.  ASSESSMENT/PLAN:  1. Vaginal candidiasis: -will check wet prep although clincially consistent with yeast infxn -diflucan  x1, repeat in 3 days if not better -return if not improving with this medication  2. Contraception counseling: -gave handout on birth control options, advised returning to talk with PCP if she decides she wants a different option.  FOLLOW UP: F/u as needed if symptoms worsen or do not improve.   [place] J. Pollie Meyer, MD [facility]

## 2013-12-28 ENCOUNTER — Encounter: Payer: Self-pay | Admitting: Family Medicine

## 2014-02-15 ENCOUNTER — Other Ambulatory Visit (HOSPITAL_COMMUNITY)
Admission: RE | Admit: 2014-02-15 | Discharge: 2014-02-15 | Disposition: A | Discharge status: Home / Self Care | Payer: Self-pay | Source: Ambulatory Visit | Attending: Family Medicine | Admitting: Family Medicine

## 2014-02-15 ENCOUNTER — Ambulatory Visit (INDEPENDENT_AMBULATORY_CARE_PROVIDER_SITE_OTHER): Payer: Self-pay | Admitting: Family Medicine

## 2014-02-15 ENCOUNTER — Encounter: Payer: Self-pay | Admitting: Family Medicine

## 2014-02-15 VITALS — BP 113/74 | HR 70 | Temp 98.6°F | Ht 59.5 in | Wt 163.8 lb

## 2014-02-15 DIAGNOSIS — Z113 Encounter for screening for infections with a predominantly sexual mode of transmission: Secondary | ICD-10-CM | POA: Insufficient documentation

## 2014-02-15 DIAGNOSIS — N898 Other specified noninflammatory disorders of vagina: Secondary | ICD-10-CM

## 2014-02-15 LAB — POCT WET PREP (WET MOUNT): Clue Cells Wet Prep Whiff POC: POSITIVE

## 2014-02-15 LAB — POCT URINE PREGNANCY: Preg Test, Ur: NEGATIVE

## 2014-02-15 MED ORDER — METRONIDAZOLE 500 MG PO TABS
500.0000 mg | ORAL_TABLET | Freq: Two times a day (BID) | ORAL | Status: DC
Start: 2014-02-15 — End: 2014-05-15

## 2014-02-15 MED ORDER — FLUCONAZOLE 150 MG PO TABS: 150.0000 mg | ORAL_TABLET | Freq: Once | ORAL | Status: DC

## 2014-02-15 NOTE — Progress Notes (Signed)
Patient ID: Jill Rush, female   DOB: 06-04-1990, 23 y.o.   MRN: [id number]   [facility] Jill FerrettiMelanie C Sena Clouatre, MD Phone: [phone]  Subjective:   Jill Rush is a 23 y.o F who presents for vaginal discharge. Pt describes 2 days of thin white discharge without smell or itching. Pt seen by Dr. Pollie MeyerMcIntyre for yeast on 10/30/2013. Has had sex for the past month with intermittent protection. LMP 01/15/14. Would like to be screened for STD.   All relevant systems were reviewed and were negative unless otherwise noted in the HPI  Past Medical History Reviewed problem list.  Medications- reviewed and updated Current Outpatient Prescriptions  Medication Sig Dispense Refill  . acyclovir (ZOVIRAX) 400 MG tablet take 1 tablet by mouth three times a day 15 tablet 3  . fluconazole (DIFLUCAN) 150 MG tablet Take 1 tablet (150 mg total) by mouth once. Repeat in 3 days if not better. 2 tablet 0  . sodium chloride (OCEAN) 0.65 % SOLN nasal spray Place 1 spray into both nostrils as needed for congestion. 480 mL 0   No current facility-administered medications for this visit.   Chief complaint-noted No additions to family history Social history- patient is a never smoker  Objective: BP 113/74 mmHg  Pulse 70  Temp(Src) 98.6 F (37 C) (Oral)  Wt 163 lb 12.8 oz (74.299 kg)  LMP 01/16/2014 Gen: NAD, alert, cooperative with exam GU: thin copious white discharge in the vaginal vault ; no external lesions  Skin: several areas of folliculitis   Assessment/Plan:  1. Vaginal discharge - POCT urine pregnancy- neg - POCT Wet Prep ([place]et [place])- BV and trich - Cervicovaginal ancillary only- GCCT - metroNIDAZOLE (FLAGYL) 500 MG tablet; Take 1 tablet (500 mg total) by mouth 2 (two) times daily.  Dispense: 14 tablet; Refill: 0 -diflucan as proph -partner treatment as well  Jill FerrettiMelanie C Love Chowning, MD Family Medicine PGY-2 Please page or call with questions

## 2014-02-15 NOTE — Patient Instructions (Signed)
Ms Jill Rush it was great to meet you today!  I am sorry that you are not feeling well You have two infections: called bacterial vaginosis and trichomonas  Please take the medicine twice a day for two weeks  Please return to clinic if symptoms do not improve or worsen Feel better soon Charlane FerrettiMelanie C Stephaine Breshears, MD

## 2014-02-17 LAB — CERVICOVAGINAL ANCILLARY ONLY
Chlamydia: NEGATIVE
Neisseria Gonorrhea: NEGATIVE

## 2014-05-14 ENCOUNTER — Emergency Department (HOSPITAL_COMMUNITY): Payer: Self-pay

## 2014-05-14 ENCOUNTER — Encounter (HOSPITAL_COMMUNITY): Payer: Self-pay | Admitting: *Deleted

## 2014-05-14 ENCOUNTER — Emergency Department (HOSPITAL_COMMUNITY)
Admission: EM | Admit: 2014-05-14 | Discharge: 2014-05-15 | Discharge status: Against Medical Advice | Payer: Self-pay | Attending: Emergency Medicine | Admitting: Emergency Medicine

## 2014-05-14 DIAGNOSIS — R0789 Other chest pain: Secondary | ICD-10-CM | POA: Insufficient documentation

## 2014-05-14 LAB — BASIC METABOLIC PANEL
ANION GAP: 9 (ref 5–15)
BUN: 10 mg/dL (ref 6–23)
CALCIUM: 8.9 mg/dL (ref 8.4–10.5)
CHLORIDE: 102 mmol/L (ref 96–112)
CO2: 23 mmol/L (ref 19–32)
CREATININE: 0.84 mg/dL (ref 0.50–1.10)
GFR calc non Af Amer: >90 mL/min (ref >90)
Glucose, Bld: 112 mg/dL — ABNORMAL HIGH (ref 70–99)
Potassium: 3.2 mmol/L — ABNORMAL LOW (ref 3.5–5.1)
Sodium: 134 mmol/L — ABNORMAL LOW (ref 135–145)

## 2014-05-14 LAB — CBC
HCT: 37.7 % (ref 36.0–46.0)
Hemoglobin: 13.1 g/dL (ref 12.0–15.0)
MCH: 31.1 pg (ref 26.0–34.0)
MCHC: 34.7 g/dL (ref 30.0–36.0)
MCV: 89.5 fL (ref 78.0–100.0)
Platelets: 245 10*3/uL (ref 150–400)
RBC: 4.21 MIL/uL (ref 3.87–5.11)
RDW: 13.1 % (ref 11.5–15.5)
WBC: 7.1 10*3/uL (ref 4.0–10.5)

## 2014-05-14 LAB — I-STAT TROPONIN, ED: Troponin i, poc: 0 ng/mL (ref 0.00–0.08)

## 2014-05-14 NOTE — ED Notes (Signed)
Called pt in lobby, no answer.  

## 2014-05-14 NOTE — ED Notes (Signed)
Pt in c/o central chest pain, worse with movement, denies radiation of pain, states she has had pain like this in the past that was due to stress, pt c/o pain x3 days and the pain is intermittent, denies cough or congestion

## 2014-05-15 ENCOUNTER — Encounter (HOSPITAL_COMMUNITY): Payer: Self-pay | Admitting: *Deleted

## 2014-05-15 ENCOUNTER — Emergency Department (INDEPENDENT_AMBULATORY_CARE_PROVIDER_SITE_OTHER)
Admission: EM | Admit: 2014-05-15 | Discharge: 2014-05-15 | Disposition: A | Discharge status: Home / Self Care | Payer: Self-pay | Source: Home / Self Care | Attending: Family Medicine | Admitting: Family Medicine

## 2014-05-15 DIAGNOSIS — R112 Nausea with vomiting, unspecified: Secondary | ICD-10-CM

## 2014-05-15 DIAGNOSIS — R519 Headache, unspecified: Secondary | ICD-10-CM

## 2014-05-15 DIAGNOSIS — K2970 Gastritis, unspecified, without bleeding: Secondary | ICD-10-CM

## 2014-05-15 DIAGNOSIS — R51 Headache: Secondary | ICD-10-CM

## 2014-05-15 MED ORDER — KETOROLAC TROMETHAMINE 60 MG/2ML IM SOLN
INTRAMUSCULAR | Status: AC
  Filled 2014-05-15: qty 2

## 2014-05-15 MED ORDER — ONDANSETRON HCL 4 MG PO TABS: 4.0000 mg | ORAL_TABLET | Freq: Four times a day (QID) | ORAL | Status: DC

## 2014-05-15 MED ORDER — KETOROLAC TROMETHAMINE 60 MG/2ML IM SOLN
60.0000 mg | Freq: Once | INTRAMUSCULAR | Status: AC
  Administered 2014-05-15: 60 mg via INTRAMUSCULAR

## 2014-05-15 MED ORDER — ONDANSETRON 4 MG PO TBDP
4.0000 mg | ORAL_TABLET | Freq: Once | ORAL | Status: AC
  Administered 2014-05-15: 4 mg via ORAL

## 2014-05-15 MED ORDER — ONDANSETRON 4 MG PO TBDP
ORAL_TABLET | ORAL | Status: AC
  Filled 2014-05-15: qty 1

## 2014-05-15 NOTE — ED Provider Notes (Signed)
CSN: [id number]     Arrival date & time 05/15/14  0901 History   None    Chief Complaint  Patient presents with  . Emesis   (Consider location/radiation/quality/duration/timing/severity/associated sxs/prior Treatment) HPI Comments: 24 year old female complaining of nausea and vomiting for 3 days. She has been able to keep down small amounts of water but vomited other clear liquids. She also has pain across the midabdomen and lesser to the epigastrium. She states she had a normal bowel movement yesterday.  She is also complaining of a "migraine headache". She has never been diagnosed with a migraine. The pain is located primarily to the right temple. It feels like a pressure. There is no throbbing. She has had no fever, problems with vision, speech, hearing, swallowing, focal paresthesias or motor weakness.   Past Medical History  Diagnosis Date  . No pertinent past medical history    History reviewed. No pertinent past surgical history. Family History  Problem Relation Age of Onset  . Hypertension Mother   . Diabetes Sister     with pregnancy   History  Substance Use Topics  . Smoking status: Never Smoker   . Smokeless tobacco: Never Used  . Alcohol Use: 0.5 oz/week    1 drink(s) per week   OB History    Gravida Para Term Preterm AB TAB SAB Ectopic Multiple Living   Review of Systems  Constitutional: Positive for activity change and appetite change. Negative for fever.  Eyes: Negative for visual disturbance.  Respiratory: Negative for cough, chest tightness and shortness of breath.   Cardiovascular: Negative for chest pain, palpitations and leg swelling.  Gastrointestinal: Positive for nausea, vomiting and abdominal pain. Negative for diarrhea, constipation and abdominal distention.  Genitourinary: Negative.   Musculoskeletal: Negative.   Skin: Negative.   Neurological: Positive for headaches. Negative for tremors, seizures, syncope, facial asymmetry,  speech difficulty, weakness and numbness.    Allergies  Review of patient's allergies indicates no known allergies.  Home Medications   Prior to Admission medications   Medication Sig Start Date End Date Taking? Authorizing Provider  ondansetron (ZOFRAN) 4 MG tablet Take 1 tablet (4 mg total) by mouth every 6 (six) hours. 05/15/14   Hayden Rasmussen, NP  sodium chloride (OCEAN) 0.65 % SOLN nasal spray Place 1 spray into both nostrils as needed for congestion. 04/02/13   Josalyn Funches, MD   BP 95/86 mmHg  Pulse 74  Temp(Src) 98.8 F (37.1 C) (Oral)  Resp 18  SpO2 100%  LMP 04/21/2014 Physical Exam  Constitutional: She is oriented to person, place, and time. She appears well-developed and well-nourished. No distress.  HENT:  Head: Normocephalic and atraumatic.  Mouth/Throat: Oropharynx is clear and moist. No oropharyngeal exudate.  Bilateral TMs are normal Uvula midline. Soft palate rises symmetrically. No intraoral lesions are seen. No erythema or exudates.  Eyes: EOM are normal. Pupils are equal, round, and reactive to light.  Neck: Normal range of motion. Neck supple.  Cardiovascular: Normal rate and normal heart sounds.   Pulmonary/Chest: Effort normal and breath sounds normal. No respiratory distress. She has no wheezes. She has no rales.  Abdominal: Soft. There is tenderness. There is no rebound and no guarding.  Minor tenderness in the epigastrium. Patient points to the periumbilical area as for the area of pain. No tenderness in the mid to lower abdomen. Abdomen is soft. Most of the abdomen percusses dull to flat. Palpation  and percussion suggest that her colon is full of stool.  Musculoskeletal: Normal range of motion.  Lymphadenopathy:    She has no cervical adenopathy.  Neurological: She is alert and oriented to person, place, and time. No cranial nerve deficit. She exhibits normal muscle tone.  Skin: Skin is warm and dry.  Psychiatric: She has a normal mood and affect.   Nursing note and vitals reviewed.   ED Course  Procedures (including critical care time) Labs Review Labs Reviewed - No data to display  Imaging Review Dg Chest 2 View  05/14/2014   CLINICAL DATA:  Mid chest pain  EXAM: CHEST  2 VIEW  COMPARISON:  03/29/2012  FINDINGS: The heart size and mediastinal contours are within normal limits. Both lungs are clear. The visualized skeletal structures are unremarkable.  IMPRESSION: No radiographic evidence of active cardiopulmonary disease.   Electronically Signed   By: Jearld LeschAndrew  DelGaizo M.D.   On: 05/14/2014 20:42     MDM   1. Acute nonintractable headache, unspecified headache type   2. Gastritis   3. Non-intractable vomiting with nausea, vomiting of unspecified type    Suspect a viral gastritis. Her headache may be associated with the virus. Does not sound like she has a migraine headache. No evidence of intracranial etiology. Neurologic exam is normal. Abdomen exam suggests she may be constipated. She is given Zofran by mouth now and Rx. Toradol 60 mg IM now for headache. Instructions given for diet and clear liquids for the remainder of the day and slowly advance diet. Once her nausea and vomiting has abated and she is able to drink more fluids recommend that she start taking MiraLAX for what appears to be stool burden. Feeling of your abdomen and tapping suggests you may have some constipation.  Recommend Miralax as directed to relieve the stool burden. Dont start this until you no longer have nausea and vomiting and can drink lots of fluids.     Hayden Rasmussenavid Tamarra Geiselman, NP 05/15/14 [phone]

## 2014-05-15 NOTE — ED Provider Notes (Signed)
Patient left without being seen.   Richardean Canalavid H Yao, MD 05/15/14 [phone]

## 2014-05-15 NOTE — ED Notes (Signed)
Pt states that she was waiting and did not hear her name called.

## 2014-05-15 NOTE — ED Notes (Signed)
Pt      Reports      Vomiting         And  abd  Cramping  X  sev  Days         denys  Any  Diarrhea  Family  Member  Had   Similar  Symptoms

## 2014-05-15 NOTE — Discharge Instructions (Signed)
Gastritis, Adult Gastritis is soreness and puffiness (inflammation) of the lining of the stomach. If you do not get help, gastritis can cause bleeding and sores (ulcers) in the stomach. HOME CARE   Only take medicine as told by your doctor.  If you were given antibiotic medicines, take them as told. Finish the medicines even if you start to feel better.  Drink enough fluids to keep your pee (urine) clear or pale yellow.  Avoid foods and drinks that make your problems worse. Foods you may want to avoid include:  Caffeine or alcohol.  Chocolate.  Mint.  Garlic and onions.  Spicy foods.  Citrus fruits, including oranges, lemons, or limes.  Food containing tomatoes, including sauce, chili, salsa, and pizza.  Fried and fatty foods.  Eat small meals throughout the day instead of large meals. GET HELP RIGHT AWAY IF:   You have black or dark red poop (stools).  You throw up (vomit) blood. It may look like coffee grounds.  You cannot keep fluids down.  Your belly (abdominal) pain gets worse.  You have a fever.  You do not feel better after 1 week.  You have any other questions or concerns. MAKE SURE YOU:   Understand these instructions.  Will watch your condition.  Will get help right away if you are not doing well or get worse. Document Released: 08/01/2007 Document Revised: 05/07/2011 Document Reviewed: 03/28/2011 [facility] Patient Information 2015 [place], [place]. This information is not intended to replace advice given to you by your health care provider. Make sure you discuss any questions you have with your health care provider.  Headaches, Frequently Asked Questions MIGRAINE HEADACHES Q: What is migraine? What causes it? How can I treat it? A: Generally, migraine headaches begin as a dull ache. Then they develop into a constant, throbbing, and pulsating pain. You may experience pain at the temples. You may experience pain at the front or back of one or both sides  of the head. The pain is usually accompanied by a combination of:  Nausea.  Vomiting.  Sensitivity to light and noise. Some people (about 15%) experience an aura (see below) before an attack. The cause of migraine is believed to be chemical reactions in the brain. Treatment for migraine may include over-the-counter or prescription medications. It may also include self-help techniques. These include relaxation training and biofeedback.  Q: What is an aura? A: About 15% of people with migraine get an "aura". This is a sign of neurological symptoms that occur before a migraine headache. You may see wavy or jagged lines, dots, or flashing lights. You might experience tunnel vision or blind spots in one or both eyes. The aura can include visual or auditory hallucinations (something imagined). It may include disruptions in smell (such as strange odors), taste or touch. Other symptoms include:  Numbness.  A "pins and needles" sensation.  Difficulty in recalling or speaking the correct word. These neurological events may last as long as 60 minutes. These symptoms will fade as the headache begins. Q: What is a trigger? A: Certain physical or environmental factors can lead to or "trigger" a migraine. These include:  Foods.  Hormonal changes.  Weather.  Stress. It is important to remember that triggers are different for everyone. To help prevent migraine attacks, you need to figure out which triggers affect you. Keep a headache diary. This is a good way to track triggers. The diary will help you talk to your healthcare professional about your condition. Q: Does weather  affect migraines? A: Bright sunshine, hot, humid conditions, and drastic changes in barometric pressure may lead to, or "trigger," a migraine attack in some people. But studies have shown that weather does not act as a trigger for everyone with migraines. Q: What is the link between migraine and hormones? A: Hormones start and  regulate many of your body's functions. Hormones keep your body in balance within a constantly changing environment. The levels of hormones in your body are unbalanced at times. Examples are during menstruation, pregnancy, or menopause. That can lead to a migraine attack. In fact, about three quarters of all women with migraine report that their attacks are related to the menstrual cycle.  Q: Is there an increased risk of stroke for migraine sufferers? A: The likelihood of a migraine attack causing a stroke is very remote. That is not to say that migraine sufferers cannot have a stroke associated with their migraines. In persons under age 33, the most common associated factor for stroke is migraine headache. But over the course of a person's normal life span, the occurrence of migraine headache may actually be associated with a reduced risk of dying from cerebrovascular disease due to stroke.  Q: What are acute medications for migraine? A: Acute medications are used to treat the pain of the headache after it has started. Examples over-the-counter medications, NSAIDs, ergots, and triptans.  Q: What are the triptans? A: Triptans are the newest class of abortive medications. They are specifically targeted to treat migraine. Triptans are vasoconstrictors. They moderate some chemical reactions in the brain. The triptans work on receptors in your brain. Triptans help to restore the balance of a neurotransmitter called serotonin. Fluctuations in levels of serotonin are thought to be a main cause of migraine.  Q: Are over-the-counter medications for migraine effective? A: Over-the-counter, or "OTC," medications may be effective in relieving mild to moderate pain and associated symptoms of migraine. But you should see your caregiver before beginning any treatment regimen for migraine.  Q: What are preventive medications for migraine? A: Preventive medications for migraine are sometimes referred to as "prophylactic"  treatments. They are used to reduce the frequency, severity, and length of migraine attacks. Examples of preventive medications include antiepileptic medications, antidepressants, beta-blockers, calcium channel blockers, and NSAIDs (nonsteroidal anti-inflammatory drugs). Q: Why are anticonvulsants used to treat migraine? A: During the past few years, there has been an increased interest in antiepileptic drugs for the prevention of migraine. They are sometimes referred to as "anticonvulsants". Both epilepsy and migraine may be caused by similar reactions in the brain.  Q: Why are antidepressants used to treat migraine? A: Antidepressants are typically used to treat people with depression. They may reduce migraine frequency by regulating chemical levels, such as serotonin, in the brain.  Q: What alternative therapies are used to treat migraine? A: The term "alternative therapies" is often used to describe treatments considered outside the scope of conventional Western medicine. Examples of alternative therapy include acupuncture, acupressure, and yoga. Another common alternative treatment is herbal therapy. Some herbs are believed to relieve headache pain. Always discuss alternative therapies with your caregiver before proceeding. Some herbal products contain arsenic and other toxins. TENSION HEADACHES Q: What is a tension-type headache? What causes it? How can I treat it? A: Tension-type headaches occur randomly. They are often the result of temporary stress, anxiety, fatigue, or anger. Symptoms include soreness in your temples, a tightening band-like sensation around your head (a "vice-like" ache). Symptoms can also include a pulling feeling,  pressure sensations, and contracting head and neck muscles. The headache begins in your forehead, temples, or the back of your head and neck. Treatment for tension-type headache may include over-the-counter or prescription medications. Treatment may also include  self-help techniques such as relaxation training and biofeedback. CLUSTER HEADACHES Q: What is a cluster headache? What causes it? How can I treat it? A: Cluster headache gets its name because the attacks come in groups. The pain arrives with little, if any, warning. It is usually on one side of the head. A tearing or bloodshot eye and a runny nose on the same side of the headache may also accompany the pain. Cluster headaches are believed to be caused by chemical reactions in the brain. They have been described as the most severe and intense of any headache type. Treatment for cluster headache includes prescription medication and oxygen. SINUS HEADACHES Q: What is a sinus headache? What causes it? How can I treat it? A: When a cavity in the bones of the face and skull (a sinus) becomes inflamed, the inflammation will cause localized pain. This condition is usually the result of an allergic reaction, a tumor, or an infection. If your headache is caused by a sinus blockage, such as an infection, you will probably have a fever. An x-ray will confirm a sinus blockage. Your caregiver's treatment might include antibiotics for the infection, as well as antihistamines or decongestants.  REBOUND HEADACHES Q: What is a rebound headache? What causes it? How can I treat it? A: A pattern of taking acute headache medications too often can lead to a condition known as "rebound headache." A pattern of taking too much headache medication includes taking it more than 2 days per week or in excessive amounts. That means more than the label or a caregiver advises. With rebound headaches, your medications not only stop relieving pain, they actually begin to cause headaches. Doctors treat rebound headache by tapering the medication that is being overused. Sometimes your caregiver will gradually substitute a different type of treatment or medication. Stopping may be a challenge. Regularly overusing a medication increases the  potential for serious side effects. Consult a caregiver if you regularly use headache medications more than 2 days per week or more than the label advises. ADDITIONAL QUESTIONS AND ANSWERS Q: What is biofeedback? A: Biofeedback is a self-help treatment. Biofeedback uses special equipment to monitor your body's involuntary physical responses. Biofeedback monitors:  Breathing.  Pulse.  Heart rate.  Temperature.  Muscle tension.  Brain activity. Biofeedback helps you refine and perfect your relaxation exercises. You learn to control the physical responses that are related to stress. Once the technique has been mastered, you do not need the equipment any more. Q: Are headaches hereditary? A: Four out of five (80%) of people that suffer report a family history of migraine. Scientists are not sure if this is genetic or a family predisposition. Despite the uncertainty, a child has a 50% chance of having migraine if one parent suffers. The child has a 75% chance if both parents suffer.  Q: Can children get headaches? A: By the time they reach high school, most young people have experienced some type of headache. Many safe and effective approaches or medications can prevent a headache from occurring or stop it after it has begun.  Q: What type of doctor should I see to diagnose and treat my headache? A: Start with your primary caregiver. Discuss his or her experience and approach to headaches. Discuss methods of classification,  diagnosis, and treatment. Your caregiver may decide to recommend you to a headache specialist, depending upon your symptoms or other physical conditions. Having diabetes, allergies, etc., may require a more comprehensive and inclusive approach to your headache. The National Headache Foundation will provide, upon request, a list of [facility] physician members in your state. Document Released: 05/05/2003 Document Revised: 05/07/2011 Document Reviewed: 10/13/2007 [facility] Patient  Information 2015 [place], [place]. This information is not intended to replace advice given to you by your health care provider. Make sure you discuss any questions you have with your health care provider.  Nausea and Vomiting Nausea is a sick feeling that often comes before throwing up (vomiting). Vomiting is a reflex where stomach contents come out of your mouth. Vomiting can cause severe loss of body fluids (dehydration). Children and elderly adults can become dehydrated quickly, especially if they also have diarrhea. Nausea and vomiting are symptoms of a condition or disease. It is important to find the cause of your symptoms. CAUSES   Direct irritation of the stomach lining. This irritation can result from increased acid production (gastroesophageal reflux disease), infection, food poisoning, taking certain medicines (such as nonsteroidal anti-inflammatory drugs), alcohol use, or tobacco use.  Signals from the brain.These signals could be caused by a headache, heat exposure, an inner ear disturbance, increased pressure in the brain from injury, infection, a tumor, or a concussion, pain, emotional stimulus, or metabolic problems.  An obstruction in the gastrointestinal tract (bowel obstruction).  Illnesses such as diabetes, hepatitis, gallbladder problems, appendicitis, kidney problems, cancer, sepsis, atypical symptoms of a heart attack, or eating disorders.  Medical treatments such as chemotherapy and radiation.  Receiving medicine that makes you sleep (general anesthetic) during surgery. DIAGNOSIS Your caregiver may ask for tests to be done if the problems do not improve after a few days. Tests may also be done if symptoms are severe or if the reason for the nausea and vomiting is not clear. Tests may include:  Urine tests.  Blood tests.  Stool tests.  Cultures (to look for evidence of infection).  X-rays or other imaging studies. Test results can help your caregiver make decisions  about treatment or the need for additional tests. TREATMENT You need to stay well hydrated. Drink frequently but in small amounts.You may wish to drink water, sports drinks, clear broth, or eat frozen ice pops or gelatin dessert to help stay hydrated.When you eat, eating slowly may help prevent nausea.There are also some antinausea medicines that may help prevent nausea. HOME CARE INSTRUCTIONS   Take all medicine as directed by your caregiver.  If you do not have an appetite, do not force yourself to eat. However, you must continue to drink fluids.  If you have an appetite, eat a normal diet unless your caregiver tells you differently.  Eat a variety of complex carbohydrates (rice, wheat, potatoes, bread), lean meats, yogurt, fruits, and vegetables.  Avoid high-fat foods because they are more difficult to digest.  Drink enough water and fluids to keep your urine clear or pale yellow.  If you are dehydrated, ask your caregiver for specific rehydration instructions. Signs of dehydration may include:  Severe thirst.  Dry lips and mouth.  Dizziness.  Dark urine.  Decreasing urine frequency and amount.  Confusion.  Rapid breathing or pulse. SEEK IMMEDIATE MEDICAL CARE IF:   You have blood or brown flecks (like coffee grounds) in your vomit.  You have black or bloody stools.  You have a severe headache or stiff neck.  You are confused.  You have severe abdominal pain.  You have chest pain or trouble breathing.  You do not urinate at least once every 8 hours.  You develop cold or clammy skin.  You continue to vomit for longer than 24 to 48 hours.  You have a fever. MAKE SURE YOU:   Understand these instructions.  Will watch your condition.  Will get help right away if you are not doing well or get worse. Document Released: 02/12/2005 Document Revised: 05/07/2011 Document Reviewed: 07/12/2010 [facility] Patient Information 2015 [place], [place]. This information  is not intended to replace advice given to you by your health care provider. Make sure you discuss any questions you have with your health care provider.

## 2014-05-15 NOTE — ED Notes (Signed)
Pt now states that she does not want to wait again. Explained that she gets the next room. She again states that she is leaving.

## 2014-05-19 ENCOUNTER — Other Ambulatory Visit (HOSPITAL_COMMUNITY)
Admission: RE | Admit: 2014-05-19 | Discharge: 2014-05-19 | Disposition: A | Discharge status: Home / Self Care | Payer: Self-pay | Source: Ambulatory Visit | Attending: Family Medicine | Admitting: Family Medicine

## 2014-05-19 ENCOUNTER — Encounter (HOSPITAL_COMMUNITY): Payer: Self-pay

## 2014-05-19 ENCOUNTER — Emergency Department (INDEPENDENT_AMBULATORY_CARE_PROVIDER_SITE_OTHER)
Admission: EM | Admit: 2014-05-19 | Discharge: 2014-05-19 | Disposition: A | Discharge status: Home / Self Care | Payer: Self-pay | Source: Home / Self Care | Attending: Family Medicine | Admitting: Family Medicine

## 2014-05-19 DIAGNOSIS — N760 Acute vaginitis: Secondary | ICD-10-CM

## 2014-05-19 DIAGNOSIS — Z113 Encounter for screening for infections with a predominantly sexual mode of transmission: Secondary | ICD-10-CM | POA: Insufficient documentation

## 2014-05-19 LAB — CERVICOVAGINAL ANCILLARY ONLY
Wet Prep (BD Affirm): NEGATIVE
Wet Prep (BD Affirm): NEGATIVE
Wet Prep (BD Affirm): POSITIVE — AB

## 2014-05-19 LAB — POCT PREGNANCY, URINE: PREG TEST UR: NEGATIVE

## 2014-05-19 MED ORDER — FLUCONAZOLE 150 MG PO TABS: 150.0000 mg | ORAL_TABLET | Freq: Every day | ORAL | Status: DC

## 2014-05-19 MED ORDER — METRONIDAZOLE 500 MG PO TABS: 500.0000 mg | ORAL_TABLET | Freq: Two times a day (BID) | ORAL | Status: DC

## 2014-05-19 NOTE — ED Provider Notes (Signed)
CSN: [id number]     Arrival date & time 05/19/14  1308 History   First MD Initiated Contact with Patient 05/19/14 [phone]     Chief Complaint  Patient presents with  . Vaginal Discharge   (Consider location/radiation/quality/duration/timing/severity/associated sxs/prior Treatment) Patient is a 24 y.o. female presenting with vaginal discharge. The history is provided by the patient.  Vaginal Discharge Quality:  Thick Severity:  Moderate Onset quality:  Gradual Duration:  24 hours Timing:  Constant Progression:  Worsening Chronicity:  New Associated symptoms: vaginal itching   Risk factors: unprotected sex   Risk factors: no new sexual partner     Past Medical History  Diagnosis Date  . No pertinent past medical history    History reviewed. No pertinent past surgical history. Family History  Problem Relation Age of Onset  . Hypertension Mother   . Diabetes Sister     with pregnancy   History  Substance Use Topics  . Smoking status: Never Smoker   . Smokeless tobacco: Never Used  . Alcohol Use: 0.5 oz/week    1 drink(s) per week   OB History    Gravida Para Term Preterm AB TAB SAB Ectopic Multiple Living   Review of Systems  Genitourinary: Positive for vaginal discharge.  All other systems reviewed and are negative.   Allergies  Review of patient's allergies indicates no known allergies.  Home Medications   Prior to Admission medications   Medication Sig Start Date End Date Taking? Authorizing Provider  fluconazole (DIFLUCAN) 150 MG tablet Take 1 tablet (150 mg total) by mouth daily. Single dose 05/19/14   Mathis Fare Eugen Jeansonne, PA  metroNIDAZOLE (FLAGYL) 500 MG tablet Take 1 tablet (500 mg total) by mouth 2 (two) times daily. 05/19/14   Mathis Fare Kyaira Trantham, PA  ondansetron (ZOFRAN) 4 MG tablet Take 1 tablet (4 mg total) by mouth every 6 (six) hours. 05/15/14   Hayden Rasmussen, NP  sodium chloride (OCEAN) 0.65 % SOLN nasal spray Place 1 spray into  both nostrils as needed for congestion. 04/02/13   Josalyn Funches, MD   BP 109/75 mmHg  Pulse 75  Temp(Src) 99.1 F (37.3 C) (Oral)  Resp 16  SpO2 99%  LMP 04/21/2014 (Exact Date) Physical Exam  Constitutional: She is oriented to person, place, and time. She appears well-developed and well-nourished. No distress.  HENT:  Head: Normocephalic and atraumatic.  Eyes: Conjunctivae are normal.  Cardiovascular: Normal rate.   Pulmonary/Chest: Effort normal.  Genitourinary: Uterus normal. Pelvic exam was performed with patient supine. There is tenderness on the right labia. There is no rash, lesion or injury on the right labia. There is tenderness on the left labia. There is no rash, lesion or injury on the left labia. Cervix exhibits discharge. Cervix exhibits no motion tenderness and no friability. Right adnexum displays no mass, no tenderness and no fullness. Left adnexum displays no mass, no tenderness and no fullness. There is tenderness in the vagina. No erythema or bleeding in the vagina. No foreign body around the vagina. No signs of injury around the vagina. Vaginal discharge found.  Thick green white copious vaginal discharge  Musculoskeletal: Normal range of motion.  Neurological: She is alert and oriented to person, place, and time.  Skin: Skin is warm and dry.  Psychiatric: She has a normal mood and affect. Her behavior is normal.  Nursing note and vitals reviewed.   ED Course  Procedures (including critical care time) Labs Review Labs Reviewed  URINE CULTURE  POCT PREGNANCY, URINE  CERVICOVAGINAL ANCILLARY ONLY    Imaging Review No results found.   MDM   1. Vaginitis    Patient declines empiric treatment for gonorrhea and chlamydia Will treat for BV and yeast Will advise by phone if result indicate need for additional treatment  Ria ClockJennifer Lee H Lavonia Eager, PA 05/19/14 1130

## 2014-05-19 NOTE — ED Notes (Signed)
States she has a yeast infection which started after she used a douche

## 2014-05-19 NOTE — Discharge Instructions (Signed)

## 2014-05-20 LAB — CERVICOVAGINAL ANCILLARY ONLY
CHLAMYDIA, DNA PROBE: NEGATIVE
NEISSERIA GONORRHEA: NEGATIVE

## 2014-05-21 LAB — URINE CULTURE

## 2014-05-21 NOTE — ED Notes (Signed)
GC/chlamydia neg., Affirm: Candida pos., Gardnerella and Trich neg., Urine culture: 25,000 colonies yeast.  Pt. adequately treated with Diflucan. Vassie MoselleYork, Ferdie Bakken M 05/21/2014

## 2014-06-04 ENCOUNTER — Encounter (HOSPITAL_COMMUNITY): Payer: Self-pay

## 2014-06-04 ENCOUNTER — Emergency Department (HOSPITAL_COMMUNITY)
Admission: EM | Admit: 2014-06-04 | Discharge: 2014-06-04 | Disposition: A | Discharge status: Home / Self Care | Payer: Self-pay | Attending: Emergency Medicine | Admitting: Emergency Medicine

## 2014-06-04 DIAGNOSIS — Z792 Long term (current) use of antibiotics: Secondary | ICD-10-CM | POA: Insufficient documentation

## 2014-06-04 DIAGNOSIS — K088 Other specified disorders of teeth and supporting structures: Secondary | ICD-10-CM | POA: Insufficient documentation

## 2014-06-04 DIAGNOSIS — Z79899 Other long term (current) drug therapy: Secondary | ICD-10-CM | POA: Insufficient documentation

## 2014-06-04 DIAGNOSIS — K0889 Other specified disorders of teeth and supporting structures: Secondary | ICD-10-CM

## 2014-06-04 MED ORDER — PENICILLIN V POTASSIUM 500 MG PO TABS: 500.0000 mg | ORAL_TABLET | Freq: Three times a day (TID) | ORAL | Status: DC

## 2014-06-04 MED ORDER — TRAMADOL HCL 50 MG PO TABS
50.0000 mg | ORAL_TABLET | Freq: Once | ORAL | Status: AC
  Administered 2014-06-04: 50 mg via ORAL
  Filled 2014-06-04: qty 1

## 2014-06-04 MED ORDER — TRAMADOL HCL 50 MG PO TABS: 50.0000 mg | ORAL_TABLET | Freq: Four times a day (QID) | ORAL | Status: DC | PRN

## 2014-06-04 NOTE — ED Provider Notes (Signed)
CSN: [id number]     Arrival date & time 06/04/14  [id number] History   First MD Initiated Contact with Patient 06/04/14 [phone]     Chief Complaint  Patient presents with  . Dental Pain     (Consider location/radiation/quality/duration/timing/severity/associated sxs/prior Treatment) HPI Ms. Cheree DittoGraham is a 24 y.o. presenting with right lower tooth pain x 3 days. Upper right molar broke a few months ago and pieces of tooth have been breaking off over the past week. Pain has worsened; can't chew food, drink fluids or sleep due to the pain. No pain while swallowing. No fever or swelling. Had two molars removed in the same area a couple of years ago but currently does not have a dentist or much money so would like resources.   Past Medical History  Diagnosis Date  . No pertinent past medical history    History reviewed. No pertinent past surgical history. Family History  Problem Relation Age of Onset  . Hypertension Mother   . Diabetes Sister     with pregnancy   History  Substance Use Topics  . Smoking status: Never Smoker   . Smokeless tobacco: Never Used  . Alcohol Use: 0.5 oz/week    1 drink(s) per week   OB History    Gravida Para Term Preterm AB TAB SAB Ectopic Multiple Living   1    1 1          Review of Systems  Constitutional: Negative for fever and chills.  HENT: Positive for dental problem. Negative for trouble swallowing and voice change.   Respiratory: Negative for shortness of breath.       Allergies  Review of patient's allergies indicates no known allergies.  Home Medications   Prior to Admission medications   Medication Sig Start Date End Date Taking? Authorizing Provider  fluconazole (DIFLUCAN) 150 MG tablet Take 1 tablet (150 mg total) by mouth daily. Single dose 05/19/14   Mathis FareJennifer Lee H Presson, PA  metroNIDAZOLE (FLAGYL) 500 MG tablet Take 1 tablet (500 mg total) by mouth 2 (two) times daily. 05/19/14   Mathis FareJennifer Lee H Presson, PA  ondansetron (ZOFRAN) 4 MG  tablet Take 1 tablet (4 mg total) by mouth every 6 (six) hours. 05/15/14   Hayden Rasmussenavid Mabe, NP  sodium chloride (OCEAN) 0.65 % SOLN nasal spray Place 1 spray into both nostrils as needed for congestion. 04/02/13   Josalyn Funches, MD   BP 117/67 mmHg  Pulse 84  Temp(Src) 98.2 F (36.8 C) (Oral)  Resp 16  SpO2 100%  LMP 05/25/2014 Physical Exam   GENERAL: This is a well-appearing 24 year old female. HEENT: Normocephalic and atraumatic. Oropharynx is clear. Mucous membranes are moist. Upper right-sided gum swelling behind back molar. There is no evidence of fullness or induration in the submandibular, sublingual or buccal spaces bilaterally. There is no elevation of the tongue. There is no trismus. NECK: Supple. Trachea is midline. CHEST: Clear to auscultation bilaterally. She has no increased work of breathing. HEART: Regular rate and rhythm. NEUROLOGIC: Oriented x4 with a normal and nonataxic gait. ED Course  Procedures (including critical care time) Labs Review Labs Reviewed - No data to display  Imaging Review No results found.   EKG Interpretation None      MDM   Final diagnoses:  Dentalgia    Patient with toothache.  No gross abscess.  Exam unconcerning for Ludwig's angina or spread of infection.  Will treat with penicillin and pain medicine.  Urged patient to follow-up with dentist.  Arthor Captain, PA-C 06/04/14 1536  Zadie Rhine, MD 06/04/14 1540

## 2014-06-04 NOTE — Discharge Instructions (Signed)
You have been diagnosed with Dental pain. Please call the follow up dentist first thing in the morning on Monday for a follow up appointment. Keep your discharge paperwork from today's visit to bring to the dentist office. You may also use the resource guide listed below to help you find a dentist if you do not already have one to followup with. It is very important that you get evaluated by a dentist as soon as possible.  Use your pain medication as prescribed and do not operate heavy machinery while on pain medication. Note that your pain medication contains acetaminophen (Tylenol) & its is not reccommended that you use additional acetaminophen (Tylenol) while taking this medication. Take your full course of antibiotics. Read the instructions below. ° °Eat a soft or liquid diet and rinse your mouth out after meals with warm water. You should see a dentist or return here at once if you have increased swelling, increased pain or uncontrolled bleeding from the site of your injury. ° ° °SEEK MEDICAL CARE IF:  °· You have increased pain not controlled with medicines.  °· You have swelling around your tooth, in your face or neck.  °· You have bleeding which starts, continues, or gets worse.  °· You have a fever >101 °· If you are unable to open your mouth °Soft Diet  °The soft diet may be recommended after you were put on a full liquid diet. A normal diet may follow. The soft diet can also be used after surgery if you are too ill to keep down a normal diet. The soft diet may also be needed if you have a hard time chewing foods.  °DESCRIPTION  °Tender foods are used. Foods do not need to be ground or pureed. Most raw fruits and vegetables and coarse breads and cereals should be avoided. Fried foods and highly seasoned foods may cause discomfort.  °NUTRITIONAL ADEQUACY  °A healthy diet is possible if foods from each of the basic food groups are eaten daily.  °SOFT DIET FOOD LISTS  °Milk/Dairy  °Allowed: Milk and milk  drinks, milk shakes, cream cheese, cottage cheese, mild cheeses.  °Avoid: Sharp or highly seasoned cheese. °Meat/Meat Substitutes  °Allowed: Broiled, roasted, baked, or stewed tender lean beef, mutton, lamb, veal, chicken, turkey, liver, ham, crisp bacon, white fish, tuna, salmon. Eggs, smooth peanut butter.  °Avoid: All fried meats, fish, or fowl. Rich gravies and sauces. Lunch meats, sausages, hot dogs. Meats with gristle, chunky peanut butter. °Breads/Grains  °Allowed: Rice, noodles, spaghetti, macaroni. Dry or cooked refined cereals, such as farina, cream of wheat, oatmeal, grits, whole-wheat cereals. Plain or toasted white or wheat blend or whole-grain breads, soda crackers or saltines, flour tortillas.  °Avoid: Wild rice, coarse cereals, such as bran. Seed in or on breads and crackers. Bread or bread products with nuts or seeds. °Fruits/Vegetables  °Allowed: Fruit and vegetable juices, well-cooked or canned fruits and vegetables, any dried fruit. One citrus fruit daily, 1 vitamin A source daily. Well-ripened, easy to chew fruits, sweet potatoes. Baked, boiled, mashed, creamed, scalloped, or au gratin potatoes. Broths or creamed soups made with allowed vegetables, strained tomatoes.  °Avoid: All gas-forming vegetables (corn, radishes, Brussels sprouts, onions, broccoli, cabbage, parsnips, turnips, chili peppers, pinto beans, split peas, dried beans). Fruits containing seeds and skin. Potato chips and corn chips. All others that are not made with allowed vegetables. Highly seasoned soups. °Desserts/Sweets  °Allowed: Simple desserts, such as custard, junkets, gelatin desserts, plain ice cream and sherbets, simple cakes   and cookies, allowed fruits, sugar, syrup, jelly, honey, plain hard candy, and molasses.  °Avoid: Rich pastries, any dessert containing dates, nuts, raisins, or coconut. Fried pastries, such as doughnuts. Chocolate. °Beverages  °Allowed: Fruit and vegetable juices. Caffeine-free carbonated drinks,  coffee, and tea.  °Avoid: Caffeinated beverages: coffee, tea, soda or pop. °Miscellaneous  °Allowed: Butter, cream, margarine, mayonnaise, oil. Cream sauces, salt, and mild spices.  °Avoid: Highly spiced salad dressings. Highly seasoned foods, hot sauce, mustard, horseradish, and pepper. °SAMPLE MENU  °Breakfast  °Orange juice.  °Oatmeal.  °Soft cooked egg.  °Toast and margarine.  °2% milk.  °Coffee. °Lunch  °Meatloaf.  °Mashed potato.  °Green beans.  °Lemon pudding.  °Bread and margarine.  °Coffee. °Dinner  °Consommé or apricot nectar.  °Chicken breast.  °Rice, peas, and carrots.  °Applesauce.  °Bread and margarine.  °2% milk. °To cut the amount of fat in your diet, omit margarine and use 1% or skim milk.  °NUTRIENT ANALYSIS  °Calories........................1953 Kcal.  °Protein.........................102 gm.  °Carbohydrate...............247 gm.  °Fat................................65 gm.  °Cholesterol...................449 mg.  °Dietary fiber.................19 gm.  °Vitamin A.....................2944 RE.  °Vitamin C.....................79 mg.  °Niacin..........................25 mg.  °Riboflavin....................2.0 mg.  °Thiamin.......................1.5 mg.  °Folate..........................249 mcg.  °Calcium.......................1030 mg.  °Phosphorus.................1782 mg.  °Zinc..............................12 mg.  °Iron..............................13 mg.  °Sodium.........................299 mg.  °Potassium....................3046 mg. °Document Released: 05/22/2007 Document Revised: 05/07/2011 Document Reviewed: 05/22/2007  °ExitCare® Patient Information ©2014 [employer].  ° °RESOURCE GUIDE ° ° °Dental Problems ° °Dr. Janna Civilis °$200 dollar visit °[address] °[place], [place] [address]  °[phone] °  ° °Patients with Medicaid: °[facility]                     [facility] °[address]                                           [address] °Phone:   [phone]                                                  Phone:  510-2600 ° °If unable to pay or uninsured, contact:  [facility] or [facility] to become qualified for the adult dental clinic. ° °Chronic Pain Problems °Contact [facility]  [phone] °Patients need to be referred by their primary care doctor. ° °Insufficient Money for Medicine °Contact [employer]:  call "211" or [facility] 271-5999. ° °No Primary Care Doctor °Call [facility]  832-8000 °Other agencies that provide inexpensive medical care °   [facility]  [phone] °   [facility]  [phone] °   [facility]  [phone] °   [facility]  832-4777 °   [facility]  373-0678 °   [facility]  [phone] ° °Psychological Services °[facility]  [phone] °[facility]  [phone] °[facility]   [phone] (emergency services 641-4993) ° °Substance Abuse Resources °[facility]  336-882-2125 °[facility] 336-784-9470 °[facility] 336-285-9073 °[facility] 336-845-3988 °Residential & Outpatient Substance Abuse Program  800-659-3381 ° °Abuse/Neglect °[place] [facility] (336) 641-3795 °[place] [facility] Hotline 800-378-5315 (After Hours) ° °Emergency Shelter °[facility] (336) 271-5985 ° °Maternity Homes °[facility] (336) 275-9566 °[facility] (704) 372-4663 ° °MRSA Hotline #:   832-7006 ° ° ° °[place] [facility] ° °[facility] [facility]     [employer]                          [facility] °[address] [place]                       [address]      [address]  °[place]                                                [place]                            [place] °Phone:  349-3220                                   Phone:  [phone]                 Phone:   342-8140 ° °[facility] °Phone:  342-8316 ° °[place] [facility] Hotline °(336) 342-1394 °(336) 342-3537 (After Hours) ° ° ° ° ° ° °

## 2014-06-04 NOTE — ED Notes (Signed)
Pt with dental pain x 3 days.  

## 2014-07-22 ENCOUNTER — Encounter: Payer: Self-pay | Admitting: Family Medicine

## 2014-07-22 ENCOUNTER — Encounter (HOSPITAL_COMMUNITY): Payer: Self-pay | Admitting: Emergency Medicine

## 2014-07-22 ENCOUNTER — Ambulatory Visit (INDEPENDENT_AMBULATORY_CARE_PROVIDER_SITE_OTHER): Payer: Self-pay | Admitting: Family Medicine

## 2014-07-22 ENCOUNTER — Emergency Department (HOSPITAL_COMMUNITY)
Admission: EM | Admit: 2014-07-22 | Discharge: 2014-07-22 | Disposition: A | Discharge status: Home / Self Care | Payer: Self-pay | Attending: Emergency Medicine | Admitting: Emergency Medicine

## 2014-07-22 VITALS — BP 105/80 | HR 104 | Temp 98.7°F | Ht 60.0 in | Wt 163.2 lb

## 2014-07-22 DIAGNOSIS — R197 Diarrhea, unspecified: Secondary | ICD-10-CM | POA: Insufficient documentation

## 2014-07-22 DIAGNOSIS — R112 Nausea with vomiting, unspecified: Secondary | ICD-10-CM | POA: Insufficient documentation

## 2014-07-22 DIAGNOSIS — R6883 Chills (without fever): Secondary | ICD-10-CM | POA: Insufficient documentation

## 2014-07-22 DIAGNOSIS — K529 Noninfective gastroenteritis and colitis, unspecified: Secondary | ICD-10-CM | POA: Insufficient documentation

## 2014-07-22 DIAGNOSIS — R51 Headache: Secondary | ICD-10-CM | POA: Insufficient documentation

## 2014-07-22 DIAGNOSIS — R1915 Other abnormal bowel sounds: Secondary | ICD-10-CM | POA: Insufficient documentation

## 2014-07-22 DIAGNOSIS — R1084 Generalized abdominal pain: Secondary | ICD-10-CM | POA: Insufficient documentation

## 2014-07-22 DIAGNOSIS — R000 Tachycardia, unspecified: Secondary | ICD-10-CM | POA: Insufficient documentation

## 2014-07-22 DIAGNOSIS — Z3202 Encounter for pregnancy test, result negative: Secondary | ICD-10-CM | POA: Insufficient documentation

## 2014-07-22 LAB — COMPREHENSIVE METABOLIC PANEL
ALK PHOS: 41 U/L (ref 38–126)
ALT: 11 U/L — AB (ref 14–54)
AST: 15 U/L (ref 15–41)
Albumin: 4.3 g/dL (ref 3.5–5.0)
Anion gap: 9 (ref 5–15)
BUN: 8 mg/dL (ref 6–20)
CO2: 21 mmol/L — AB (ref 22–32)
Calcium: 8.7 mg/dL — ABNORMAL LOW (ref 8.9–10.3)
Chloride: 108 mmol/L (ref 101–111)
Creatinine, Ser: 0.75 mg/dL (ref 0.44–1.00)
GFR calc Af Amer: >60 mL/min (ref >60)
GLUCOSE: 94 mg/dL (ref 65–99)
Potassium: 3.9 mmol/L (ref 3.5–5.1)
SODIUM: 138 mmol/L (ref 135–145)
Total Bilirubin: 1.6 mg/dL — ABNORMAL HIGH (ref 0.3–1.2)
Total Protein: 8.6 g/dL — ABNORMAL HIGH (ref 6.5–8.1)

## 2014-07-22 LAB — CBC WITH DIFFERENTIAL/PLATELET
BASOS PCT: 0 % (ref 0–1)
Basophils Absolute: 0 10*3/uL (ref 0.0–0.1)
EOS PCT: 2 % (ref 0–5)
Eosinophils Absolute: 0.1 10*3/uL (ref 0.0–0.7)
HCT: 42.5 % (ref 36.0–46.0)
Hemoglobin: 14.2 g/dL (ref 12.0–15.0)
Lymphocytes Relative: 14 % (ref 12–46)
Lymphs Abs: 1 10*3/uL (ref 0.7–4.0)
MCH: 30.6 pg (ref 26.0–34.0)
MCHC: 33.4 g/dL (ref 30.0–36.0)
MCV: 91.6 fL (ref 78.0–100.0)
MONO ABS: 0.4 10*3/uL (ref 0.1–1.0)
Monocytes Relative: 5 % (ref 3–12)
Neutro Abs: 5.6 10*3/uL (ref 1.7–7.7)
Neutrophils Relative %: 79 % — ABNORMAL HIGH (ref 43–77)
PLATELETS: 245 10*3/uL (ref 150–400)
RBC: 4.64 MIL/uL (ref 3.87–5.11)
RDW: 13.1 % (ref 11.5–15.5)
WBC: 7.1 10*3/uL (ref 4.0–10.5)

## 2014-07-22 LAB — URINALYSIS, ROUTINE W REFLEX MICROSCOPIC
Glucose, UA: NEGATIVE mg/dL
Hgb urine dipstick: NEGATIVE
Ketones, ur: NEGATIVE mg/dL
Leukocytes, UA: NEGATIVE
Nitrite: NEGATIVE
PH: 6.5 (ref 5.0–8.0)
Protein, ur: NEGATIVE mg/dL
SPECIFIC GRAVITY, URINE: 1.028 (ref 1.005–1.030)
Urobilinogen, UA: 1 mg/dL (ref 0.0–1.0)

## 2014-07-22 LAB — POC URINE PREG, ED
PREG TEST UR: NEGATIVE
Preg Test, Ur: NEGATIVE

## 2014-07-22 LAB — URINE MICROSCOPIC-ADD ON

## 2014-07-22 LAB — LIPASE, BLOOD: Lipase: 22 U/L (ref 22–51)

## 2014-07-22 MED ORDER — ONDANSETRON HCL 4 MG PO TABS: 4.0000 mg | ORAL_TABLET | Freq: Three times a day (TID) | ORAL | Status: DC | PRN

## 2014-07-22 MED ORDER — SODIUM CHLORIDE 0.9 % IV SOLN: INTRAVENOUS | Status: DC

## 2014-07-22 MED ORDER — HYDROCODONE-ACETAMINOPHEN 5-325 MG PO TABS: 1.0000 | ORAL_TABLET | Freq: Four times a day (QID) | ORAL | Status: DC | PRN

## 2014-07-22 MED ORDER — ONDANSETRON HCL 4 MG PO TABS
8.0000 mg | ORAL_TABLET | Freq: Once | ORAL | Status: AC
  Administered 2014-07-22: 8 mg via ORAL

## 2014-07-22 MED ORDER — ONDANSETRON HCL 4 MG/2ML IJ SOLN
4.0000 mg | Freq: Once | INTRAMUSCULAR | Status: DC
  Filled 2014-07-22: qty 2

## 2014-07-22 MED ORDER — ONDANSETRON 4 MG PO TBDP
4.0000 mg | ORAL_TABLET | Freq: Once | ORAL | Status: AC
  Administered 2014-07-22: 4 mg via ORAL
  Filled 2014-07-22: qty 1

## 2014-07-22 NOTE — Patient Instructions (Addendum)
Use zofran for nausea, maalox for gas and pain (you can buy this over the counter), and vicodin as needed for severe pain  Viral Gastroenteritis Viral gastroenteritis is also known as stomach flu. This condition affects the stomach and intestinal tract. It can cause sudden diarrhea and vomiting. The illness typically lasts 3 to 8 days. Most people develop an immune response that eventually gets rid of the virus. While this natural response develops, the virus can make you quite ill. CAUSES  Many different viruses can cause gastroenteritis, such as rotavirus or noroviruses. You can catch one of these viruses by consuming contaminated food or water. You may also catch a virus by sharing utensils or other personal items with an infected person or by touching a contaminated surface. SYMPTOMS  The most common symptoms are diarrhea and vomiting. These problems can cause a severe loss of body fluids (dehydration) and a body salt (electrolyte) imbalance. Other symptoms may include:  Fever.  Headache.  Fatigue.  Abdominal pain. DIAGNOSIS  Your caregiver can usually diagnose viral gastroenteritis based on your symptoms and a physical exam. A stool sample may also be taken to test for the presence of viruses or other infections. TREATMENT  This illness typically goes away on its own. Treatments are aimed at rehydration. The most serious cases of viral gastroenteritis involve vomiting so severely that you are not able to keep fluids down. In these cases, fluids must be given through an intravenous line (IV). HOME CARE INSTRUCTIONS   Drink enough fluids to keep your urine clear or pale yellow. Drink small amounts of fluids frequently and increase the amounts as tolerated.  Ask your caregiver for specific rehydration instructions.  Avoid:  Foods high in sugar.  Alcohol.  Carbonated drinks.  Tobacco.  Juice.  Caffeine drinks.  Extremely hot or cold fluids.  Fatty, greasy foods.  Too much  intake of anything at one time.  Dairy products until 24 to 48 hours after diarrhea stops.  You may consume probiotics. Probiotics are active cultures of beneficial bacteria. They may lessen the amount and number of diarrheal stools in adults. Probiotics can be found in yogurt with active cultures and in supplements.  Wash your hands well to avoid spreading the virus.  Only take over-the-counter or prescription medicines for pain, discomfort, or fever as directed by your caregiver. Do not give aspirin to children. Antidiarrheal medicines are not recommended.  Ask your caregiver if you should continue to take your regular prescribed and over-the-counter medicines.  Keep all follow-up appointments as directed by your caregiver. SEEK IMMEDIATE MEDICAL CARE IF:   You are unable to keep fluids down.  You do not urinate at least once every 6 to 8 hours.  You develop shortness of breath.  You notice blood in your stool or vomit. This may look like coffee grounds.  You have abdominal pain that increases or is concentrated in one small area (localized).  You have persistent vomiting or diarrhea.  You have a fever.  The patient is a child younger than 3 months, and he or she has a fever.  The patient is a child older than 3 months, and he or she has a fever and persistent symptoms.  The patient is a child older than 3 months, and he or she has a fever and symptoms suddenly get worse.  The patient is a baby, and he or she has no tears when crying. MAKE SURE YOU:   Understand these instructions.  Will watch your condition.  Will get help right away if you are not doing well or get worse. Document Released: 02/12/2005 Document Revised: 05/07/2011 Document Reviewed: 11/29/2010 [facility] Patient Information 2015 [place], [place]. This information is not intended to replace advice given to you by your health care provider. Make sure you discuss any questions you have with your health care  provider.

## 2014-07-22 NOTE — Discharge Instructions (Signed)
Take the medication as directed. Return as needed for worsening symptoms.  °

## 2014-07-22 NOTE — ED Provider Notes (Signed)
CSN: [id number]     Arrival date & time 07/22/14  [id number] History   First MD Initiated Contact with Patient 07/22/14 1002     Chief Complaint  Patient presents with  . Abdominal Pain  . Emesis     (Consider location/radiation/quality/duration/timing/severity/associated sxs/prior Treatment) Patient is a 24 y.o. female presenting with abdominal pain and vomiting. The history is provided by the patient.  Abdominal Pain Pain location:  Generalized Pain quality: bloating and shooting   Pain radiates to:  Does not radiate Pain severity:  Severe Onset quality:  Sudden Timing:  Constant Progression:  Worsening Chronicity:  New Context: suspicious food intake   Relieved by:  None tried Worsened by:  Nothing tried Ineffective treatments:  None tried Associated symptoms: chills, diarrhea, nausea and vomiting   Associated symptoms: no chest pain, no cough, no dysuria, no fever and no shortness of breath   Emesis Associated symptoms: abdominal pain, chills, diarrhea and headaches (mild)    Jill Rush is a 24 y.o. female who presents to the ED with n/v/d and abdominal pain that started approximately 4:45 am. She ate [employer] last night but no problem until this am. Other people ate the same but not sick. One episode of diarrhea and of vomiting. Continues to feel nausea and tried to vomit here in trash can but did not.   Past Medical History  Diagnosis Date  . No pertinent past medical history    History reviewed. No pertinent past surgical history. Family History  Problem Relation Age of Onset  . Hypertension Mother   . Diabetes Sister     with pregnancy   History  Substance Use Topics  . Smoking status: Never Smoker   . Smokeless tobacco: Never Used  . Alcohol Use: 0.5 oz/week    1 drink(s) per week   OB History    Gravida Para Term Preterm AB TAB SAB Ectopic Multiple Living   1    1 1          Review of Systems  Constitutional: Positive for chills. Negative for fever.   Eyes: Negative for redness, itching and visual disturbance.  Respiratory: Negative for cough, chest tightness and shortness of breath.   Cardiovascular: Negative for chest pain and palpitations.  Gastrointestinal: Positive for nausea, vomiting, abdominal pain and diarrhea.  Genitourinary: Negative for dysuria, urgency, frequency and decreased urine volume.  Musculoskeletal: Negative for back pain, joint swelling and neck pain.  Skin: Negative for rash.  Neurological: Positive for headaches (mild). Negative for light-headedness.  Psychiatric/Behavioral: Negative for confusion. The patient is not nervous/anxious.       Allergies  Review of patient's allergies indicates no known allergies.  Home Medications   Prior to Admission medications   Medication Sig Start Date End Date Taking? Authorizing Provider  fluconazole (DIFLUCAN) 150 MG tablet Take 1 tablet (150 mg total) by mouth daily. Single dose Patient not taking: Reported on 07/22/2014 05/19/14   Ria ClockJennifer Lee H Presson, PA  HYDROcodone-acetaminophen (NORCO/VICODIN) 5-325 MG per tablet Take 1 tablet by mouth every 6 (six) hours as needed for severe pain. 07/22/14   Abram SanderElena M Adamo, MD  metroNIDAZOLE (FLAGYL) 500 MG tablet Take 1 tablet (500 mg total) by mouth 2 (two) times daily. Patient not taking: Reported on 07/22/2014 05/19/14   Mathis FareJennifer Lee H Presson, PA  ondansetron (ZOFRAN) 4 MG tablet Take 1 tablet (4 mg total) by mouth every 8 (eight) hours as needed for nausea or vomiting. 07/22/14   Hope Orlene OchM Neese, NP  penicillin v potassium (VEETID) 500 MG tablet Take 1 tablet (500 mg total) by mouth 3 (three) times daily. Patient not taking: Reported on 07/22/2014 06/04/14   Arthor Captain, PA-C  sodium chloride (OCEAN) 0.65 % SOLN nasal spray Place 1 spray into both nostrils as needed for congestion. Patient not taking: Reported on 07/22/2014 04/02/13   Dessa Phi, MD  traMADol (ULTRAM) 50 MG tablet Take 1 tablet (50 mg total) by mouth every 6  (six) hours as needed. Patient not taking: Reported on 07/22/2014 06/04/14   Arthor Captain, PA-C   BP 115/78 mmHg  Pulse 104  Temp(Src) 98.3 F (36.8 C) (Oral)  Resp 16  SpO2 98%  LMP 06/25/2014 Physical Exam  Constitutional: She is oriented to person, place, and time. She appears well-developed and well-nourished. No distress.  HENT:  Head: Normocephalic.  Eyes: EOM are normal.  Neck: Neck supple.  Cardiovascular: Tachycardia present.   Pulmonary/Chest: Effort normal.  Abdominal: Soft. Bowel sounds are increased. There is no tenderness. There is no CVA tenderness.  Musculoskeletal: Normal range of motion.  Neurological: She is alert and oriented to person, place, and time. No cranial nerve deficit.  Skin: Skin is warm and dry.  Psychiatric: She has a normal mood and affect. Her behavior is normal.  Nursing note and vitals reviewed.   ED Course  Procedures (including critical care time) IV hydration, zofran  Patient declined IV fluids will give Zofran ODT 4 mg.   Labs Review No results found for this or any previous visit (from the past 24 hour(s)).  @ 1130 patient feeling better after Zofran. If she can tolerate PO fluids will plan to d/c home with Zofran ODT.    MDM  24 y.o. female with n/v/d. Symptoms improved with Zofran. Stable for d/c without nausea at this time. Clear liquids today and then advance to [employer]. Discussed with the patient and all questioned fully answered. She will return if any problems arise.   Final diagnoses:  Nausea vomiting and diarrhea      Janne Napoleon, [place] 07/23/14 2143  Blake Divine, MD 07/27/14 1120

## 2014-07-22 NOTE — ED Notes (Signed)
Pt c/o generalized abdominal pain and emesis onset this morning. Pt states that she believes it may be related to [facility].

## 2014-07-23 NOTE — Progress Notes (Signed)
   Subjective:    Patient ID: Jill Rush, female    DOB: 1990-06-24, 24 y.o.   MRN: [id number]  HPI (S) Jill Rush is a 24 y.o. female with complaint of gastrointestinal symptoms of watery diarrhea, abdominal pain, lower abdominal cramps, upper abdominal cramps, nausea, vomiting for 1 days. No blood in stool. Symptoms started about 12 hours after eating [employer] chicken that appeared undercooked. Others who ate with her are not ill. [facility] and was zofran which helped for a little while but nausea is back and crampy abdominal pain is severe.   Review of Systems No fever.    Objective:   Physical Exam  Constitutional: She is oriented to person, place, and time. She appears well-developed and well-nourished. She appears distressed.  HENT:  Head: Normocephalic and atraumatic.  Eyes: Conjunctivae are normal. Right eye exhibits no discharge. Left eye exhibits no discharge.  Cardiovascular: Normal rate.   Pulmonary/Chest: Effort normal. No respiratory distress.  Abdominal: Soft. She exhibits no distension and no mass. There is tenderness. There is no rebound and no guarding.  Diffuse mild tenderness to palpation  Musculoskeletal: She exhibits no edema.  Neurological: She is alert and oriented to person, place, and time.  Skin: Skin is warm. No rash noted. She is diaphoretic.  Psychiatric: She has a normal mood and affect. Her behavior is normal.  Nursing note and vitals reviewed.         Assessment & Plan:

## 2014-07-23 NOTE — Assessment & Plan Note (Signed)
Crampy abd pain, n/v/d for ~24 hours - continue zofran odt prn - add maalox w/ antigas for pain/cramps/gas - vicodin prn severe pain - rec do not take immodium - rtc if not improving in 48 hours

## 2014-07-29 ENCOUNTER — Other Ambulatory Visit: Payer: Self-pay | Admitting: Family Medicine

## 2014-08-21 ENCOUNTER — Emergency Department (HOSPITAL_COMMUNITY)
Admission: EM | Admit: 2014-08-21 | Discharge: 2014-08-21 | Disposition: A | Discharge status: Home / Self Care | Payer: Self-pay | Attending: Emergency Medicine | Admitting: Emergency Medicine

## 2014-08-21 ENCOUNTER — Encounter (HOSPITAL_COMMUNITY): Payer: Self-pay | Admitting: *Deleted

## 2014-08-21 DIAGNOSIS — Y92410 Unspecified street and highway as the place of occurrence of the external cause: Secondary | ICD-10-CM | POA: Insufficient documentation

## 2014-08-21 DIAGNOSIS — S3992XA Unspecified injury of lower back, initial encounter: Secondary | ICD-10-CM | POA: Insufficient documentation

## 2014-08-21 DIAGNOSIS — Y998 Other external cause status: Secondary | ICD-10-CM | POA: Insufficient documentation

## 2014-08-21 DIAGNOSIS — Y9389 Activity, other specified: Secondary | ICD-10-CM | POA: Insufficient documentation

## 2014-08-21 DIAGNOSIS — Z79899 Other long term (current) drug therapy: Secondary | ICD-10-CM | POA: Insufficient documentation

## 2014-08-21 DIAGNOSIS — M5431 Sciatica, right side: Secondary | ICD-10-CM | POA: Insufficient documentation

## 2014-08-21 MED ORDER — METHOCARBAMOL 500 MG PO TABS: 500.0000 mg | ORAL_TABLET | Freq: Four times a day (QID) | ORAL | Status: DC

## 2014-08-21 MED ORDER — NAPROXEN 500 MG PO TABS: 500.0000 mg | ORAL_TABLET | Freq: Two times a day (BID) | ORAL | Status: DC

## 2014-08-21 MED ORDER — OXYCODONE-ACETAMINOPHEN 5-325 MG PO TABS: 2.0000 | ORAL_TABLET | ORAL | Status: DC | PRN

## 2014-08-21 MED ORDER — METHOCARBAMOL 500 MG PO TABS: 500.0000 mg | ORAL_TABLET | Freq: Two times a day (BID) | ORAL | Status: DC

## 2014-08-21 MED ORDER — NAPROXEN 500 MG PO TABS
500.0000 mg | ORAL_TABLET | Freq: Once | ORAL | Status: AC
  Administered 2014-08-21: 500 mg via ORAL
  Filled 2014-08-21: qty 1

## 2014-08-21 NOTE — Discharge Instructions (Signed)
Back Exercises Return for any bowel or bladder incontinence or retention, weakness or numbness to the lower extremities. Take naproxen or Tylenol for pain and Percocet for breakthrough pain. Do not drive or operate heavy machinery while using these medications. Back exercises help treat and prevent back injuries. The goal of back exercises is to increase the strength of your abdominal and back muscles and the flexibility of your back. These exercises should be started when you no longer have back pain. Back exercises include:  Pelvic Tilt. Lie on your back with your knees bent. Tilt your pelvis until the lower part of your back is against the floor. Hold this position 5 to 10 sec and repeat 5 to 10 times.  Knee to Chest. Pull first 1 knee up against your chest and hold for 20 to 30 seconds, repeat this with the other knee, and then both knees. This may be done with the other leg straight or bent, whichever feels better.  Sit-Ups or Curl-Ups. Bend your knees 90 degrees. Start with tilting your pelvis, and do a partial, slow sit-up, lifting your trunk only 30 to 45 degrees off the floor. Take at least 2 to 3 seconds for each sit-up. Do not do sit-ups with your knees out straight. If partial sit-ups are difficult, simply do the above but with only tightening your abdominal muscles and holding it as directed.  Hip-Lift. Lie on your back with your knees flexed 90 degrees. Push down with your feet and shoulders as you raise your hips a couple inches off the floor; hold for 10 seconds, repeat 5 to 10 times.  Back arches. Lie on your stomach, propping yourself up on bent elbows. Slowly press on your hands, causing an arch in your low back. Repeat 3 to 5 times. Any initial stiffness and discomfort should lessen with repetition over time.  Shoulder-Lifts. Lie face down with arms beside your body. Keep hips and torso pressed to floor as you slowly lift your head and shoulders off the floor. Do not overdo your  exercises, especially in the beginning. Exercises may cause you some mild back discomfort which lasts for a few minutes; however, if the pain is more severe, or lasts for more than 15 minutes, do not continue exercises until you see your caregiver. Improvement with exercise therapy for back problems is slow.  See your caregivers for assistance with developing a proper back exercise program. Document Released: 03/22/2004 Document Revised: 05/07/2011 Document Reviewed: 12/14/2010 [facility] Patient Information 2015 [place], [place]. This information is not intended to replace advice given to you by your health care provider. Make sure you discuss any questions you have with your health care provider.
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
## 2014-09-12 ENCOUNTER — Emergency Department (HOSPITAL_COMMUNITY): Payer: BLUE CROSS/BLUE SHIELD

## 2014-09-12 ENCOUNTER — Encounter (HOSPITAL_COMMUNITY): Payer: Self-pay

## 2014-09-12 ENCOUNTER — Emergency Department (HOSPITAL_COMMUNITY)
Admission: EM | Admit: 2014-09-12 | Discharge: 2014-09-12 | Disposition: A | Discharge status: Home / Self Care | Payer: BLUE CROSS/BLUE SHIELD | Attending: Emergency Medicine | Admitting: Emergency Medicine

## 2014-09-12 DIAGNOSIS — Z791 Long term (current) use of non-steroidal anti-inflammatories (NSAID): Secondary | ICD-10-CM | POA: Insufficient documentation

## 2014-09-12 DIAGNOSIS — Y998 Other external cause status: Secondary | ICD-10-CM | POA: Insufficient documentation

## 2014-09-12 DIAGNOSIS — Z79899 Other long term (current) drug therapy: Secondary | ICD-10-CM | POA: Diagnosis not present

## 2014-09-12 DIAGNOSIS — Y93E1 Activity, personal bathing and showering: Secondary | ICD-10-CM | POA: Diagnosis not present

## 2014-09-12 DIAGNOSIS — W182XXA Fall in (into) shower or empty bathtub, initial encounter: Secondary | ICD-10-CM | POA: Diagnosis not present

## 2014-09-12 DIAGNOSIS — M545 Low back pain, unspecified: Secondary | ICD-10-CM

## 2014-09-12 DIAGNOSIS — Y9289 Other specified places as the place of occurrence of the external cause: Secondary | ICD-10-CM | POA: Insufficient documentation

## 2014-09-12 DIAGNOSIS — S3992XA Unspecified injury of lower back, initial encounter: Secondary | ICD-10-CM | POA: Insufficient documentation

## 2014-09-12 DIAGNOSIS — Z3202 Encounter for pregnancy test, result negative: Secondary | ICD-10-CM | POA: Diagnosis not present

## 2014-09-12 LAB — POC URINE PREG, ED: PREG TEST UR: NEGATIVE

## 2014-09-12 MED ORDER — IBUPROFEN 800 MG PO TABS: 800.0000 mg | ORAL_TABLET | Freq: Three times a day (TID) | ORAL | Status: DC

## 2014-09-12 MED ORDER — HYDROCODONE-ACETAMINOPHEN 5-325 MG PO TABS: 1.0000 | ORAL_TABLET | ORAL | Status: DC | PRN

## 2014-09-12 MED ORDER — IBUPROFEN 800 MG PO TABS
800.0000 mg | ORAL_TABLET | Freq: Once | ORAL | Status: AC
  Administered 2014-09-12: 800 mg via ORAL
  Filled 2014-09-12: qty 1
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
## 2014-09-12 NOTE — Discharge Instructions (Signed)
Back Pain, Adult °Low back pain is very common. About 1 in 5 people have back pain. The cause of low back pain is rarely dangerous. The pain often gets better over time. About half of people with a sudden onset of back pain feel better in just 2 weeks. About 8 in 10 people feel better by 6 weeks.  °CAUSES °Some common causes of back pain include: °· Strain of the muscles or ligaments supporting the spine. °· Wear and tear (degeneration) of the spinal discs. °· Arthritis. °· Direct injury to the back. °DIAGNOSIS °Most of the time, the direct cause of low back pain is not known. However, back pain can be treated effectively even when the exact cause of the pain is unknown. Answering your caregiver's questions about your overall health and symptoms is one of the most accurate ways to make sure the cause of your pain is not dangerous. If your caregiver needs more information, he or she may order lab work or imaging tests (X-rays or MRIs). However, even if imaging tests show changes in your back, this usually does not require surgery. °HOME CARE INSTRUCTIONS °For many people, back pain returns. Since low back pain is rarely dangerous, it is often a condition that people can learn to manage on their own.  °· Remain active. It is stressful on the back to sit or stand in one place. Do not sit, drive, or stand in one place for more than 30 minutes at a time. Take short walks on level surfaces as soon as pain allows. Try to increase the length of time you walk each day. °· Do not stay in bed. Resting more than 1 or 2 days can delay your recovery. °· Do not avoid exercise or work. Your body is made to move. It is not dangerous to be active, even though your back may hurt. Your back will likely heal faster if you return to being active before your pain is gone. °· Pay attention to your body when you  bend and lift. Many people have less discomfort when lifting if they bend their knees, keep the load close to their bodies, and  avoid twisting. Often, the most comfortable positions are those that put less stress on your recovering back. °· Find a comfortable position to sleep. Use a firm mattress and lie on your side with your knees slightly bent. If you lie on your back, put a pillow under your knees. °· Only take over-the-counter or prescription medicines as directed by your caregiver. Over-the-counter medicines to reduce pain and inflammation are often the most helpful. Your caregiver may prescribe muscle relaxant drugs. These medicines help dull your pain so you can more quickly return to your normal activities and healthy exercise. °· Put ice on the injured area. °¨ Put ice in a plastic bag. °¨ Place a towel between your skin and the bag. °¨ Leave the ice on for 15-20 minutes, 03-04 times a day for the first 2 to 3 days. After that, ice and heat may be alternated to reduce pain and spasms. °· Ask your caregiver about trying back exercises and gentle massage. This may be of some benefit. °· Avoid feeling anxious or stressed. Stress increases muscle tension and can worsen back pain. It is important to recognize when you are anxious or stressed and learn ways to manage it. Exercise is a great option. °SEEK MEDICAL CARE IF: °· You have pain that is not relieved with rest or medicine. °· You have pain that does not improve in 1 week. °· You have new symptoms. °· You are generally not feeling well. °SEEK   IMMEDIATE MEDICAL CARE IF:  °· You have pain that radiates from your back into your legs. °· You develop new bowel or bladder control problems. °· You have unusual weakness or numbness in your arms or legs. °· You develop nausea or vomiting. °· You develop abdominal pain. °· You feel faint. °Document Released: 02/12/2005 Document Revised: 08/14/2011 Document Reviewed: 06/16/2013 °[employer]® Patient Information ©2015 [employer]. This information is not intended to replace advice given to you by your health care provider. Make sure you  discuss any questions you have with your health care provider. °Cryotherapy °Cryotherapy means treatment with cold. Ice or gel packs can be used to reduce both pain and swelling. Ice is the most helpful within the first 24 to 48 hours after an injury or flare-up from overusing a muscle or joint. Sprains, strains, spasms, burning pain, shooting pain, and aches can all be eased with ice. Ice can also be used when recovering from surgery. Ice is effective, has very few side effects, and is safe for most people to use. °PRECAUTIONS  °Ice is not a safe treatment option for people with: °· Raynaud phenomenon. This is a condition affecting small blood vessels in the extremities. Exposure to cold may cause your problems to return. °· Cold hypersensitivity. There are many forms of cold hypersensitivity, including: °¨ Cold urticaria. Red, itchy hives appear on the skin when the tissues begin to warm after being iced. °¨ Cold erythema. This is a red, itchy rash caused by exposure to cold. °¨ Cold hemoglobinuria. Red blood cells break down when the tissues begin to warm after being iced. The hemoglobin that carry oxygen are passed into the urine because they cannot combine with blood proteins fast enough. °· Numbness or altered sensitivity in the area being iced. °If you have any of the following conditions, do not use ice until you have discussed cryotherapy with your caregiver: °· Heart conditions, such as arrhythmia, angina, or chronic heart disease. °· High blood pressure. °· Healing wounds or open skin in the area being iced. °· Current infections. °· Rheumatoid arthritis. °· Poor circulation. °· Diabetes. °Ice slows the blood flow in the region it is applied. This is beneficial when trying to stop inflamed tissues from spreading irritating chemicals to surrounding tissues. However, if you expose your skin to cold temperatures for too long or without the proper protection, you can damage your skin or nerves. Watch for  signs of skin damage due to cold. °HOME CARE INSTRUCTIONS °Follow these tips to use ice and cold packs safely. °· Place a dry or damp towel between the ice and skin. A damp towel will cool the skin more quickly, so you may need to shorten the time that the ice is used. °· For a more rapid response, add gentle compression to the ice. °· Ice for no more than 10 to 20 minutes at a time. The bonier the area you are icing, the less time it will take to get the benefits of ice. °· Check your skin after 5 minutes to make sure there are no signs of a poor response to cold or skin damage. °· Rest 20 minutes or more between uses. °· Once your skin is numb, you can end your treatment. You can test numbness by very lightly touching your skin. The touch should be so light that you do not see the skin dimple from the pressure of your fingertip. When using ice, most people will feel these normal sensations in this order: cold,   burning, aching, and numbness. °· Do not use ice on someone who cannot communicate their responses to pain, such as small children or people with dementia. °HOW TO MAKE AN ICE PACK °Ice packs are the most common way to use ice therapy. Other methods include ice massage, ice baths, and cryosprays. Muscle creams that cause a cold, tingly feeling do not offer the same benefits that ice offers and should not be used as a substitute unless recommended by your caregiver. °To make an ice pack, do one of the following: °· Place crushed ice or a bag of frozen vegetables in a sealable plastic bag. Squeeze out the excess air. Place this bag inside another plastic bag. Slide the bag into a pillowcase or place a damp towel between your skin and the bag. °· Mix 3 parts water with 1 part rubbing alcohol. Freeze the mixture in a sealable plastic bag. When you remove the mixture from the freezer, it will be slushy. Squeeze out the excess air. Place this bag inside another plastic bag. Slide the bag into a pillowcase or place  a damp towel between your skin and the bag. °SEEK MEDICAL CARE IF: °· You develop white spots on your skin. This may give the skin a blotchy (mottled) appearance. °· Your skin turns blue or pale. °· Your skin becomes waxy or hard. °· Your swelling gets worse. °MAKE SURE YOU:  °· Understand these instructions. °· Will watch your condition. °· Will get help right away if you are not doing well or get worse. °Document Released: 10/09/2010 Document Revised: 06/29/2013 Document Reviewed: 10/09/2010 °Exit[employer]® Patient Information ©2015 [employer]. This information is not intended to replace advice given to you by your health care provider. Make sure you discuss any questions you have with your health care provider. ° °
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
## 2014-09-27 ENCOUNTER — Other Ambulatory Visit: Payer: Self-pay | Admitting: Obstetrics & Gynecology

## 2014-09-28 LAB — CYTOLOGY - PAP

## 2014-10-15 ENCOUNTER — Ambulatory Visit: Payer: Self-pay | Admitting: Family Medicine

## 2014-11-19 ENCOUNTER — Ambulatory Visit (INDEPENDENT_AMBULATORY_CARE_PROVIDER_SITE_OTHER): Payer: BLUE CROSS/BLUE SHIELD | Admitting: *Deleted

## 2014-11-19 DIAGNOSIS — Z111 Encounter for screening for respiratory tuberculosis: Secondary | ICD-10-CM | POA: Diagnosis not present

## 2014-11-19 DIAGNOSIS — Z23 Encounter for immunization: Secondary | ICD-10-CM | POA: Diagnosis not present

## 2014-11-22 ENCOUNTER — Ambulatory Visit (INDEPENDENT_AMBULATORY_CARE_PROVIDER_SITE_OTHER): Payer: BLUE CROSS/BLUE SHIELD | Admitting: *Deleted

## 2014-11-22 ENCOUNTER — Encounter: Payer: Self-pay | Admitting: *Deleted

## 2014-11-22 ENCOUNTER — Ambulatory Visit: Payer: BLUE CROSS/BLUE SHIELD | Admitting: Family Medicine

## 2014-11-22 DIAGNOSIS — Z111 Encounter for screening for respiratory tuberculosis: Secondary | ICD-10-CM

## 2014-11-22 DIAGNOSIS — Z7689 Persons encountering health services in other specified circumstances: Secondary | ICD-10-CM

## 2014-11-22 LAB — TB SKIN TEST
Induration: 0 mm
TB Skin Test: NEGATIVE

## 2014-11-22 NOTE — Progress Notes (Signed)
   PPD Reading Note PPD read and results entered in [employer]. Result: 0 mm induration. Interpretation: Negative If test not read within 48-72 hours of initial placement, patient advised to repeat in other arm 1-3 weeks after this test. Allergic reaction: no  Martin, Tamika L, RN  

## 2014-11-23 ENCOUNTER — Ambulatory Visit (INDEPENDENT_AMBULATORY_CARE_PROVIDER_SITE_OTHER): Payer: BLUE CROSS/BLUE SHIELD | Admitting: Family Medicine

## 2014-11-23 ENCOUNTER — Encounter: Payer: Self-pay | Admitting: Family Medicine

## 2014-11-23 ENCOUNTER — Other Ambulatory Visit (HOSPITAL_COMMUNITY)
Admission: RE | Admit: 2014-11-23 | Discharge: 2014-11-23 | Disposition: A | Discharge status: Home / Self Care | Payer: BLUE CROSS/BLUE SHIELD | Source: Ambulatory Visit | Attending: Family Medicine | Admitting: Family Medicine

## 2014-11-23 VITALS — BP 119/69 | HR 75 | Temp 98.3°F | Wt 157.0 lb

## 2014-11-23 DIAGNOSIS — Z7251 High risk heterosexual behavior: Secondary | ICD-10-CM | POA: Diagnosis not present

## 2014-11-23 DIAGNOSIS — B3731 Acute candidiasis of vulva and vagina: Secondary | ICD-10-CM

## 2014-11-23 DIAGNOSIS — N898 Other specified noninflammatory disorders of vagina: Secondary | ICD-10-CM

## 2014-11-23 DIAGNOSIS — Z113 Encounter for screening for infections with a predominantly sexual mode of transmission: Secondary | ICD-10-CM | POA: Insufficient documentation

## 2014-11-23 DIAGNOSIS — B373 Candidiasis of vulva and vagina: Secondary | ICD-10-CM

## 2014-11-23 LAB — POCT WET PREP (WET MOUNT): CLUE CELLS WET PREP WHIFF POC: NEGATIVE

## 2014-11-23 MED ORDER — FLUCONAZOLE 150 MG PO TABS: 150.0000 mg | ORAL_TABLET | Freq: Once | ORAL | Status: DC

## 2014-11-23 NOTE — Patient Instructions (Signed)
It was a pleasure seeing you today, Ms Cahn.  Information regarding what we discussed is included in this packet.  I will contact you will the results of your labs.  If anything is abnormal, I will call you.  Otherwise, expect a copy to be mailed to you.  Please make an appointment to see your PCP if symptoms do not improve.  Please feel free to call our office at [phone] if any questions or concerns arise.  Warm Regards, Ashly M. Gottschalk, DO Monilial Vaginitis Vaginitis in a soreness, swelling and redness (inflammation) of the vagina and vulva. Monilial vaginitis is not a sexually transmitted infection. CAUSES  Yeast vaginitis is caused by yeast (candida) that is normally found in your vagina. With a yeast infection, the candida has overgrown in number to a point that upsets the chemical balance. SYMPTOMS   White, thick vaginal discharge.  Swelling, itching, redness and irritation of the vagina and possibly the lips of the vagina (vulva).  Burning or painful urination.  Painful intercourse. DIAGNOSIS  Things that may contribute to monilial vaginitis are:  Postmenopausal and virginal states.  Pregnancy.  Infections.  Being tired, sick or stressed, especially if you had monilial vaginitis in the past.  Diabetes. Good control will help lower the chance.  Birth control pills.  Tight fitting garments.  Using bubble bath, feminine sprays, douches or deodorant tampons.  Taking certain medications that kill germs (antibiotics).  Sporadic recurrence can occur if you become ill. TREATMENT  Your caregiver will give you medication.  There are several kinds of anti monilial vaginal creams and suppositories specific for monilial vaginitis. For recurrent yeast infections, use a suppository or cream in the vagina 2 times a week, or as directed.  Anti-monilial or steroid cream for the itching or irritation of the vulva may also be used. Get your caregiver's  permission.  Painting the vagina with methylene blue solution may help if the monilial cream does not work.  Eating yogurt may help prevent monilial vaginitis. HOME CARE INSTRUCTIONS   Finish all medication as prescribed.  Do not have sex until treatment is completed or after your caregiver tells you it is okay.  Take warm sitz baths.  Do not douche.  Do not use tampons, especially scented ones.  Wear cotton underwear.  Avoid tight pants and panty hose.  Tell your sexual partner that you have a yeast infection. They should go to their caregiver if they have symptoms such as mild rash or itching.  Your sexual partner should be treated as well if your infection is difficult to eliminate.  Practice safer sex. Use condoms.  Some vaginal medications cause latex condoms to fail. Vaginal medications that harm condoms are:  Cleocin cream.  Butoconazole (Femstat).  Terconazole (Terazol) vaginal suppository.  Miconazole (Monistat) (may be purchased over the counter). SEEK MEDICAL CARE IF:   You have a temperature by mouth above 102 F (38.9 C).  The infection is getting worse after 2 days of treatment.  The infection is not getting better after 3 days of treatment.  You develop blisters in or around your vagina.  You develop vaginal bleeding, and it is not your menstrual period.  You have pain when you urinate.  You develop intestinal problems.  You have pain with sexual intercourse. Document Released: 11/22/2004 Document Revised: 05/07/2011 Document Reviewed: 08/06/2008 [facility] Patient Information 2015 [place], [place]. This information is not intended to replace advice given to you by your health care provider. Make sure you discuss  any questions you have with your health care provider.

## 2014-11-23 NOTE — Progress Notes (Signed)
    Subjective: CC: vaginal discharge HPI: Patient is a 24 y.o. female presenting to clinic today for same day appointment. Concerns today include:  1. Vaginal discharge Developed white, cheesy discharge on Thursday.  Endorses pruritis.  No OTC meds.  She reports that she had abx about 1 month ago.  Started using a new soap about 2 weeks ago.  Otherwise, no changes.  Has 1 female partner.  Monogamous.  No concern for STD.  No fevers, chills, nausea or vomiting.  She reports occ headache.  Uses condoms every time.  FamHx and MedHx reviewed.  Please see EMR. Health Maintenance: flu shot due  ROS: All other systems reviewed and are negative.  Objective: Office vital signs reviewed. BP 119/69 mmHg  Pulse 75  Temp(Src) 98.3 F (36.8 C) (Oral)  Wt 157 lb (71.215 kg)  LMP 11/15/2014  Physical Examination:  General: Awake, alert, well nourished, NAD HEENT: Normal, MMM, EOMI GU: external vaginal tissue normal in appearance, cervical os difficult to visualize (2/2 patient's inability to relax pelvic muscles), moderate white cheesy discharge with odor appreciate in vaginal vault, no bleeding, no cervical motion TTP, no adnexal masses appreciated. Extremities: WWP, No edema, cyanosis or clubbing; +2 pulses bilaterally  Results for orders placed or performed in visit on 11/23/14 (from the past 72 hour(s))  POCT Wet Prep [facility] [place])     Status: Abnormal   Collection Time: 11/23/14  2:54 PM  Result Value Ref Range   Source Wet Prep POC VAG    WBC, Wet Prep HPF POC LOADED    Bacteria Wet Prep HPF POC Many (A) None, Few   Clue Cells Wet Prep HPF POC None None   Clue Cells Wet Prep Whiff POC Negative Whiff    Yeast Wet Prep HPF POC Moderate    Trichomonas Wet Prep HPF POC NONE    Assessment/ Plan: 24 y.o. female with  1. Vaginal discharge.  Yeast appreciated on wet prep.   - POCT Wet Prep [facility]) - Cervicovaginal ancillary only - HIV antibody - RPR - Diflucan  PO x1.  -  Return if no improvement.  2. High risk sexual behavior - POCT Wet Prep [facility]) - Cervicovaginal ancillary only - HIV antibody - RPR - Will CALL with results.  Patient does not want results mailed. - f/u with PCP as needed for routine care.  Raliegh Ip, DO PGY-2, [facility]

## 2014-11-24 LAB — RPR

## 2014-11-24 LAB — HIV ANTIBODY (ROUTINE TESTING W REFLEX): HIV 1&2 Ab, 4th Generation: NONREACTIVE

## 2014-11-24 LAB — CERVICOVAGINAL ANCILLARY ONLY
Chlamydia: NEGATIVE
Neisseria Gonorrhea: NEGATIVE

## 2015-02-28 ENCOUNTER — Emergency Department (HOSPITAL_COMMUNITY)
Admission: EM | Admit: 2015-02-28 | Discharge: 2015-02-28 | Disposition: A | Discharge status: Home / Self Care | Payer: BLUE CROSS/BLUE SHIELD | Attending: Emergency Medicine | Admitting: Emergency Medicine

## 2015-02-28 ENCOUNTER — Encounter (HOSPITAL_COMMUNITY): Payer: Self-pay | Admitting: Emergency Medicine

## 2015-02-28 DIAGNOSIS — Z3202 Encounter for pregnancy test, result negative: Secondary | ICD-10-CM | POA: Diagnosis not present

## 2015-02-28 DIAGNOSIS — K529 Noninfective gastroenteritis and colitis, unspecified: Secondary | ICD-10-CM | POA: Insufficient documentation

## 2015-02-28 DIAGNOSIS — R112 Nausea with vomiting, unspecified: Secondary | ICD-10-CM | POA: Diagnosis present

## 2015-02-28 LAB — URINALYSIS, ROUTINE W REFLEX MICROSCOPIC
Bilirubin Urine: NEGATIVE
GLUCOSE, UA: NEGATIVE mg/dL
Hgb urine dipstick: NEGATIVE
KETONES UR: NEGATIVE mg/dL
LEUKOCYTES UA: NEGATIVE
NITRITE: NEGATIVE
Protein, ur: NEGATIVE mg/dL
Specific Gravity, Urine: 1.03 (ref 1.005–1.030)
pH: 6 (ref 5.0–8.0)

## 2015-02-28 LAB — CBC
HCT: 43.4 % (ref 36.0–46.0)
Hemoglobin: 14.5 g/dL (ref 12.0–15.0)
MCH: 31.3 pg (ref 26.0–34.0)
MCHC: 33.4 g/dL (ref 30.0–36.0)
MCV: 93.7 fL (ref 78.0–100.0)
PLATELETS: 245 10*3/uL (ref 150–400)
RBC: 4.63 MIL/uL (ref 3.87–5.11)
RDW: 13.3 % (ref 11.5–15.5)
WBC: 6.9 10*3/uL (ref 4.0–10.5)

## 2015-02-28 LAB — COMPREHENSIVE METABOLIC PANEL
ALK PHOS: 45 U/L (ref 38–126)
ALT: 12 U/L — ABNORMAL LOW (ref 14–54)
AST: 16 U/L (ref 15–41)
Albumin: 4.4 g/dL (ref 3.5–5.0)
Anion gap: 7 (ref 5–15)
BUN: 14 mg/dL (ref 6–20)
CALCIUM: 8.8 mg/dL — AB (ref 8.9–10.3)
CO2: 24 mmol/L (ref 22–32)
CREATININE: 0.65 mg/dL (ref 0.44–1.00)
Chloride: 106 mmol/L (ref 101–111)
Glucose, Bld: 101 mg/dL — ABNORMAL HIGH (ref 65–99)
Potassium: 4 mmol/L (ref 3.5–5.1)
Sodium: 137 mmol/L (ref 135–145)
TOTAL PROTEIN: 8.9 g/dL — AB (ref 6.5–8.1)
Total Bilirubin: 1.6 mg/dL — ABNORMAL HIGH (ref 0.3–1.2)

## 2015-02-28 LAB — RAPID URINE DRUG SCREEN, HOSP PERFORMED
Amphetamines: NOT DETECTED
BARBITURATES: NOT DETECTED
Benzodiazepines: NOT DETECTED
Cocaine: NOT DETECTED
Opiates: NOT DETECTED
Tetrahydrocannabinol: NOT DETECTED

## 2015-02-28 LAB — LIPASE, BLOOD: Lipase: 29 U/L (ref 11–51)

## 2015-02-28 LAB — POC URINE PREG, ED: Preg Test, Ur: NEGATIVE

## 2015-02-28 MED ORDER — ONDANSETRON HCL 4 MG PO TABS: 4.0000 mg | ORAL_TABLET | Freq: Four times a day (QID) | ORAL | Status: DC

## 2015-02-28 MED ORDER — HYDROCODONE-ACETAMINOPHEN 5-325 MG PO TABS
2.0000 | ORAL_TABLET | ORAL | Status: DC | PRN
Start: 2015-02-28 — End: 2016-04-26

## 2015-02-28 MED ORDER — ONDANSETRON 4 MG PO TBDP
4.0000 mg | ORAL_TABLET | Freq: Once | ORAL | Status: AC
  Administered 2015-02-28: 4 mg via ORAL
  Filled 2015-02-28: qty 1

## 2015-02-28 NOTE — ED Notes (Signed)
Patient refused saline lock at this time. Stating, "I do not have anyone to drive me home and won't be able to take a strong pain medicine."

## 2015-02-28 NOTE — ED Provider Notes (Addendum)
CSN: [id number]     Arrival date & time 02/28/15  1618 History   First MD Initiated Contact with Patient 02/28/15 1823     Chief Complaint  Patient presents with  . Emesis      HPI Patient presents with nausea vomiting with some abdominal pain which started this morning.  Patient's had history of nausea and vomiting in the past.  Patient is around children who have recently had gastroenteritis.  Denies any fever or chills.  Has no melena or hematochezia. Past Medical History  Diagnosis Date  . No pertinent past medical history    History reviewed. No pertinent past surgical history. Family History  Problem Relation Age of Onset  . Hypertension Mother   . Diabetes Sister     with pregnancy   Social History  Substance Use Topics  . Smoking status: Never Smoker   . Smokeless tobacco: Never Used  . Alcohol Use: 0.5 oz/week    1 drink(s) per week   OB History    Gravida Para Term Preterm AB TAB SAB Ectopic Multiple Living   1    1 1          Review of Systems  All other systems reviewed and are negative.    Allergies  Review of patient's allergies indicates no known allergies.  Home Medications   Prior to Admission medications   Medication Sig Start Date End Date Taking? Authorizing Provider  HYDROcodone-acetaminophen (NORCO/VICODIN) 5-325 MG tablet Take 2 tablets by mouth every 4 (four) hours as needed. 02/28/15   Nelva Nayobert Matty Deamer, MD  ondansetron (ZOFRAN) 4 MG tablet Take 1 tablet (4 mg total) by mouth every 6 (six) hours. 02/28/15   Nelva Nayobert Tameshia Bonneville, MD   BP 112/78 mmHg  Pulse 106  Temp(Src) 98.8 F (37.1 C) (Oral)  Resp 16  SpO2 100%  LMP 02/08/2015 Physical Exam Physical Exam  Nursing note and vitals reviewed. Constitutional: She is oriented to person, place, and time. She appears well-developed and well-nourished. No distress.  HENT:  Head: Normocephalic and atraumatic.  Eyes: Pupils are equal, round, and reactive to light.  Neck: Normal range of motion.   Cardiovascular: Normal rate and intact distal pulses.   Pulmonary/Chest: No respiratory distress.  Abdominal: Normal appearance. She exhibits no distension.  Mild tenderness to palpation.  No rebound guarding tenderness.  No peritoneal signs.  Active bowel sounds in all quadrants.   Musculoskeletal: Normal range of motion.  Neurological: She is alert and oriented to person, place, and time. No cranial nerve deficit.  Skin: Skin is warm and dry. No rash noted.  Psychiatric: She has a normal mood and affect. Her behavior is normal.   ED Course  Procedures (including critical care time) Medications  ondansetron (ZOFRAN-ODT) disintegrating tablet 4 mg (not administered)    I ordered IV fluids but patient refused the IV. Labs Review Labs Reviewed  COMPREHENSIVE METABOLIC PANEL - Abnormal; Notable for the following:    Glucose, Bld 101 (*)    Calcium 8.8 (*)    Total Protein 8.9 (*)    ALT 12 (*)    Total Bilirubin 1.6 (*)    All other components within normal limits  LIPASE, BLOOD  CBC  URINALYSIS, ROUTINE W REFLEX MICROSCOPIC (NOT AT [facility])  URINE RAPID DRUG SCREEN, HOSP PERFORMED  POC URINE PREG, ED      MDM   Final diagnoses:  Gastroenteritis         Nelva Nayobert Milinda Sweeney, MD 02/28/15 2103

## 2015-02-28 NOTE — Discharge Instructions (Signed)

## 2015-02-28 NOTE — ED Notes (Signed)
Per pt, states vomiting that started this am-abdominal pain

## 2015-08-15 ENCOUNTER — Other Ambulatory Visit: Payer: Self-pay | Admitting: *Deleted

## 2015-08-15 MED ORDER — ACYCLOVIR 400 MG PO TABS: 400.0000 mg | ORAL_TABLET | Freq: Three times a day (TID) | ORAL | Status: DC

## 2015-08-24 ENCOUNTER — Ambulatory Visit (INDEPENDENT_AMBULATORY_CARE_PROVIDER_SITE_OTHER): Payer: BLUE CROSS/BLUE SHIELD | Admitting: Family Medicine

## 2015-08-24 VITALS — BP 124/72 | HR 95 | Temp 98.3°F | Wt 170.0 lb

## 2015-08-24 DIAGNOSIS — J069 Acute upper respiratory infection, unspecified: Secondary | ICD-10-CM

## 2015-08-24 DIAGNOSIS — J029 Acute pharyngitis, unspecified: Secondary | ICD-10-CM

## 2015-08-24 DIAGNOSIS — N912 Amenorrhea, unspecified: Secondary | ICD-10-CM

## 2015-08-24 LAB — POCT URINE PREGNANCY: Preg Test, Ur: NEGATIVE

## 2015-08-24 LAB — POCT RAPID STREP A (OFFICE): Rapid Strep A Screen: NEGATIVE

## 2015-08-24 NOTE — Progress Notes (Signed)
Date of Visit: 08/24/2015   HPI:  Patient presents for a same day appointment to discuss not feeling well.  Has had sore throat since Monday. The next day began to have earache, headache, nausea/vomiting, and stuffy nose. Yesterday had subjective fever but did not take temperature. Vomited 3 times, last tim ethis morning. Able to eat and drink. Stooling and urinating normally. Tried nyquil, chloraseptic throat spray, lozenges. Has some zofran at home but hasn't taken. Denies any sick contacts, rashes, tick bites or tick exposures.  ROS: See HPI  PMFSH: previously healthy  PHYSICAL EXAM: BP 124/72 mmHg  Pulse 95  Temp(Src) 98.3 F (36.8 C) (Oral)  Wt 170 lb (77.111 kg)  LMP 06/29/2015 Gen: NAD, pleasant, cooperative HEENT: normocephalic, atraumatic, moist mucous membranes. Nares congested. Tympanic membranes clear bilaterally. No anterior cervical or supraclavicular lymphadenopathy. Full range of motion of neck Heart: regular rate and rhythm, no murmur Lungs: clear to auscultation bilaterally, normal work of breathing  Abdomen: soft nontender to palpation    Neuro: alert, grossly nonfocal, speech normal Skin: norashes  ASSESSMENT/PLAN:  1. Viral syndrome - suspect URI. No signs of serious bacterial infection. Rapid strep negative. Urine preg negative. Recommend supportive care with zofran, hydration, afrin, other over the counter treatments. See after visit summary for instructions. Discussed return precautions.  FOLLOW UP: Follow up as needed if symptoms worsen or fail to improve.    GrenadaBrittany J. Pollie MeyerMcIntyre, MD [facility]

## 2015-08-24 NOTE — Patient Instructions (Signed)
Can take the zofran you have at home 1 pill every 6 hours as needed  Get Afrin, an over the counter nasal spray - not for use more than 3 days total  Can also try: -nasal saline spray -salt water gargles -cough drops -teaspoon of honey -mucinex - over the counter cough medicine  Follow up if not improving by early next week, sooner if worsening or you aren't able to eat/drink/urinate normally  Be well, Dr. Pollie MeyerMcIntyre   Upper Respiratory Infection, Adult Most upper respiratory infections (URIs) are a viral infection of the air passages leading to the lungs. A URI affects the nose, throat, and upper air passages. The most common type of URI is nasopharyngitis and is typically referred to as "the common cold." URIs run their course and usually go away on their own. Most of the time, a URI does not require medical attention, but sometimes a bacterial infection in the upper airways can follow a viral infection. This is called a secondary infection. Sinus and middle ear infections are common types of secondary upper respiratory infections. Bacterial pneumonia can also complicate a URI. A URI can worsen asthma and chronic obstructive pulmonary disease (COPD). Sometimes, these complications can require emergency medical care and may be life threatening.  CAUSES Almost all URIs are caused by viruses. A virus is a type of germ and can spread from one person to another.  RISKS FACTORS You may be at risk for a URI if:   You smoke.   You have chronic heart or lung disease.  You have a weakened defense (immune) system.   You are very young or very old.   You have nasal allergies or asthma.  You work in crowded or poorly ventilated areas.  You work in health care facilities or schools. SIGNS AND SYMPTOMS  Symptoms typically develop 2-3 days after you come in contact with a cold virus. Most viral URIs last 7-10 days. However, viral URIs from the influenza virus (flu virus) can last 14-18 days  and are typically more severe. Symptoms may include:   Runny or stuffy (congested) nose.   Sneezing.   Cough.   Sore throat.   Headache.   Fatigue.   Fever.   Loss of appetite.   Pain in your forehead, behind your eyes, and over your cheekbones (sinus pain).  Muscle aches.  DIAGNOSIS  Your health care provider may diagnose a URI by:  Physical exam.  Tests to check that your symptoms are not due to another condition such as:  Strep throat.  Sinusitis.  Pneumonia.  Asthma. TREATMENT  A URI goes away on its own with time. It cannot be cured with medicines, but medicines may be prescribed or recommended to relieve symptoms. Medicines may help:  Reduce your fever.  Reduce your cough.  Relieve nasal congestion. HOME CARE INSTRUCTIONS   Take medicines only as directed by your health care provider.   Gargle warm saltwater or take cough drops to comfort your throat as directed by your health care provider.  Use a warm mist humidifier or inhale steam from a shower to increase air moisture. This may make it easier to breathe.  Drink enough fluid to keep your urine clear or pale yellow.   Eat soups and other clear broths and maintain good nutrition.   Rest as needed.   Return to work when your temperature has returned to normal or as your health care provider advises. You may need to stay home longer to avoid infecting  others. You can also use a face mask and careful hand washing to prevent spread of the virus.  Increase the usage of your inhaler if you have asthma.   Do not use any tobacco products, including cigarettes, chewing tobacco, or electronic cigarettes. If you need help quitting, ask your health care provider. PREVENTION  The best way to protect yourself from getting a cold is to practice good hygiene.   Avoid oral or hand contact with people with cold symptoms.   Wash your hands often if contact occurs.  There is no clear evidence  that vitamin C, vitamin E, echinacea, or exercise reduces the chance of developing a cold. However, it is always recommended to get plenty of rest, exercise, and practice good nutrition.  SEEK MEDICAL CARE IF:   You are getting worse rather than better.   Your symptoms are not controlled by medicine.   You have chills.  You have worsening shortness of breath.  You have brown or red mucus.  You have yellow or brown nasal discharge.  You have pain in your face, especially when you bend forward.  You have a fever.  You have swollen neck glands.  You have pain while swallowing.  You have white areas in the back of your throat. SEEK IMMEDIATE MEDICAL CARE IF:   You have severe or persistent:  Headache.  Ear pain.  Sinus pain.  Chest pain.  You have chronic lung disease and any of the following:  Wheezing.  Prolonged cough.  Coughing up blood.  A change in your usual mucus.  You have a stiff neck.  You have changes in your:  Vision.  Hearing.  Thinking.  Mood. MAKE SURE YOU:   Understand these instructions.  Will watch your condition.  Will get help right away if you are not doing well or get worse.   This information is not intended to replace advice given to you by your health care provider. Make sure you discuss any questions you have with your health care provider.   Document Released: 08/08/2000 Document Revised: 06/29/2014 Document Reviewed: 05/20/2013 Elsevier Interactive Patient Education [employer] Elsevier Inc.

## 2015-11-06 ENCOUNTER — Emergency Department (HOSPITAL_COMMUNITY): Payer: Self-pay

## 2015-11-06 ENCOUNTER — Emergency Department (HOSPITAL_COMMUNITY)
Admission: EM | Admit: 2015-11-06 | Discharge: 2015-11-06 | Disposition: A | Discharge status: Home / Self Care | Payer: Self-pay | Attending: Emergency Medicine | Admitting: Emergency Medicine

## 2015-11-06 ENCOUNTER — Encounter (HOSPITAL_COMMUNITY): Payer: Self-pay

## 2015-11-06 DIAGNOSIS — R0789 Other chest pain: Secondary | ICD-10-CM | POA: Insufficient documentation

## 2015-11-06 MED ORDER — NAPROXEN 500 MG PO TABS: 500.0000 mg | ORAL_TABLET | Freq: Two times a day (BID) | ORAL | 0 refills | Status: DC

## 2015-11-06 NOTE — ED Notes (Signed)
Pt returned from radiology department. Placed on monitor.

## 2015-11-06 NOTE — ED Triage Notes (Signed)
Patient complains of sharp chest pain intermittently x 2 days, worse with inspiration. denies cold and cough. Denies trauma. NAD. Alert and oriented.

## 2015-11-06 NOTE — ED Provider Notes (Addendum)
[facility]-EMERGENCY DEPT Provider Note   CSN: [id number] Arrival date & time: 11/06/15  0707     History   Chief Complaint Chief Complaint  Patient presents with  . Chest Pain    HPI Jill Rush is a 25 y.o. female.  The history is provided by the patient.  Chest Pain   This is a new problem. The current episode started yesterday. The problem occurs hourly. The problem has not changed since onset.Associated with: started spontaneously but did note that she felt like she was coming down with a cold last week. The pain is present in the substernal region. The pain is at a severity of 4/10. The pain is moderate. The quality of the pain is described as brief and sharp. Radiates to: left breast. Duration of episode(s) is 2 seconds. The symptoms are aggravated by deep breathing. Pertinent negatives include no abdominal pain, no cough, no dizziness, no exertional chest pressure, no fever, no leg pain, no lower extremity edema, no nausea, no near-syncope, no shortness of breath, no sputum production, no syncope and no vomiting. Treatments tried: aspirin and tylenol. The treatment provided no relief. There are no known risk factors. Past medical history comments: no hx of asthma or family hx of PE/DVT  Pertinent negatives for family medical history include: no PE and no sudden death.    Past Medical History:  Diagnosis Date  . No pertinent past medical history     Patient Active Problem List   Diagnosis Date Noted  . Gastroenteritis 07/22/2014  . UTI (urinary tract infection) 09/29/2013  . Flu-like symptoms 04/02/2013  . Dermatitis 06/03/2012  . BCP (birth control pills) initiation 01/23/2011    History reviewed. No pertinent surgical history.  OB History    Gravida Para Term Preterm AB Living   1       1     SAB TAB Ectopic Multiple Live Births     1             Home Medications    Prior to Admission medications   Medication Sig Start Date End Date Taking? Authorizing  Provider  acyclovir (ZOVIRAX) 400 MG tablet Take 1 tablet (400 mg total) by mouth 3 (three) times daily. 08/15/15   Carney LivingMarshall L Chambliss, MD  HYDROcodone-acetaminophen (NORCO/VICODIN) 5-325 MG tablet Take 2 tablets by mouth every 4 (four) hours as needed. 02/28/15   Nelva Nayobert Beaton, MD  ondansetron (ZOFRAN) 4 MG tablet Take 1 tablet (4 mg total) by mouth every 6 (six) hours. 02/28/15   Nelva Nayobert Beaton, MD    Family History Family History  Problem Relation Age of Onset  . Hypertension Mother   . Diabetes Sister     with pregnancy    Social History Social History  Substance Use Topics  . Smoking status: Never Smoker  . Smokeless tobacco: Never Used  . Alcohol use 0.5 oz/week    1 Standard drinks or equivalent per week     Allergies   Review of patient's allergies indicates no known allergies.   Review of Systems Review of Systems  Constitutional: Negative for fever.  Respiratory: Negative for cough, sputum production and shortness of breath.   Cardiovascular: Positive for chest pain. Negative for syncope and near-syncope.  Gastrointestinal: Negative for abdominal pain, nausea and vomiting.  Neurological: Negative for dizziness.  All other systems reviewed and are negative.    Physical Exam Updated Vital Signs BP 107/69 (BP Location: Right Arm)   Pulse 76   Temp 98.5 F (  36.9 C) (Oral)   Resp 18   Ht 5' (1.524 m)   Wt 175 lb (79.4 kg)   SpO2 99%   BMI 34.18 kg/m   Physical Exam  Constitutional: She is oriented to person, place, and time. She appears well-developed and well-nourished. No distress.  HENT:  Head: Normocephalic and atraumatic.  Nose: Mucosal edema and rhinorrhea present.  Mouth/Throat: Oropharynx is clear and moist.  Eyes: Conjunctivae and EOM are normal. Pupils are equal, round, and reactive to light.  Neck: Normal range of motion. Neck supple.  Cardiovascular: Normal rate, regular rhythm and intact distal pulses.   No murmur heard. Pulmonary/Chest:  Effort normal and breath sounds normal. No respiratory distress. She has no wheezes. She has no rales. She exhibits tenderness.    Abdominal: Soft. She exhibits no distension. There is no tenderness. There is no rebound and no guarding.  Musculoskeletal: Normal range of motion. She exhibits no edema or tenderness.  Neurological: She is alert and oriented to person, place, and time.  Skin: Skin is warm and dry. No rash noted. No erythema.  Psychiatric: She has a normal mood and affect. Her behavior is normal.  Nursing note and vitals reviewed.    ED Treatments / Results  Labs (all labs ordered are listed, but only abnormal results are displayed) Labs Reviewed - No data to display  EKG  EKG Interpretation  Date/Time:  Sunday November 06 2015 07:14:58 EDT Ventricular Rate:  75 PR Interval:  146 QRS Duration: 80 QT Interval:  378 QTC Calculation: 422 R Axis:   89 Text Interpretation:  Normal sinus rhythm with sinus arrhythmia Normal ECG No significant change since last tracing Confirmed by Anitra Lauth  MD, Alphonzo Lemmings ([id number]) on 11/06/2015 7:25:42 AM       Radiology Dg Chest 2 View  Result Date: 11/06/2015 CLINICAL DATA:  Chest pain. EXAM: CHEST  2 VIEW COMPARISON:  Radiographs of May 14, 2014. FINDINGS: The heart size and mediastinal contours are within normal limits. Both lungs are clear. No pneumothorax or pleural effusion is noted. The visualized skeletal structures are unremarkable. IMPRESSION: No active cardiopulmonary disease. Electronically Signed   By: Lupita Raider, M.D.   On: 11/06/2015 08:33    Procedures Procedures (including critical care time)  Medications Ordered in ED Medications - No data to display   Initial Impression / Assessment and Plan / ED Course  I have reviewed the triage vital signs and the nursing notes.  Pertinent labs & imaging results that were available during my care of the patient were reviewed by me and considered in my medical decision  making (see chart for details).  Clinical Course   Pt with atypical story for CP which is pleuritic in nature.  Heart score of 0 and no risk factors.  PERC neg.  EKG wnl.  CXR wnl.  At this time low suspicion for ACS, PE, dissection, pneumothorax. Patient's symptoms started after having some mild URI symptoms and feel most likely pleurisy. Patient takes no medications regularly and low suspicion for hypokalemia. Patient treated with NSAIDs and encouraged follow-up with PCP if not improved in one week   Final Clinical Impressions(s) / ED Diagnoses   Final diagnoses:  Chest wall pain    New Prescriptions New Prescriptions   NAPROXEN (NAPROSYN) 500 MG TABLET    Take 1 tablet (500 mg total) by mouth 2 (two) times daily.     Gwyneth Sprout, MD 11/06/15 4782    Gwyneth Sprout, MD 11/06/15 [phone]

## 2015-12-12 ENCOUNTER — Ambulatory Visit (INDEPENDENT_AMBULATORY_CARE_PROVIDER_SITE_OTHER): Payer: Self-pay | Admitting: *Deleted

## 2015-12-12 DIAGNOSIS — Z111 Encounter for screening for respiratory tuberculosis: Secondary | ICD-10-CM

## 2015-12-12 NOTE — Progress Notes (Signed)
Tuberculin skin test applied to left ventral forearm. Explained how to read the test, measuring induration not just erythema; she will come into on 48-72 hours to have test read.

## 2015-12-14 ENCOUNTER — Encounter: Payer: Self-pay | Admitting: *Deleted

## 2015-12-14 ENCOUNTER — Ambulatory Visit (INDEPENDENT_AMBULATORY_CARE_PROVIDER_SITE_OTHER): Payer: Self-pay | Admitting: *Deleted

## 2015-12-14 DIAGNOSIS — Z111 Encounter for screening for respiratory tuberculosis: Secondary | ICD-10-CM

## 2015-12-14 LAB — TB SKIN TEST
Induration: 0 mm
TB Skin Test: NEGATIVE

## 2015-12-14 NOTE — Progress Notes (Signed)
   PPD Reading Note PPD read and results entered in EpicCare. Result: 0 mm induration. Interpretation: Negative If test not read within 48-72 hours of initial placement, patient advised to repeat in other arm 1-3 weeks after this test. Allergic reaction: no  Clarence Cogswell L, RN  

## 2016-03-15 ENCOUNTER — Emergency Department (HOSPITAL_COMMUNITY)
Admission: EM | Admit: 2016-03-15 | Discharge: 2016-03-15 | Disposition: A | Discharge status: Home / Self Care | Payer: Self-pay | Attending: Emergency Medicine | Admitting: Emergency Medicine

## 2016-03-15 ENCOUNTER — Encounter (HOSPITAL_COMMUNITY): Payer: Self-pay | Admitting: Emergency Medicine

## 2016-03-15 DIAGNOSIS — R112 Nausea with vomiting, unspecified: Secondary | ICD-10-CM | POA: Insufficient documentation

## 2016-03-15 DIAGNOSIS — R197 Diarrhea, unspecified: Secondary | ICD-10-CM | POA: Insufficient documentation

## 2016-03-15 DIAGNOSIS — Z79899 Other long term (current) drug therapy: Secondary | ICD-10-CM | POA: Insufficient documentation

## 2016-03-15 LAB — URINALYSIS, ROUTINE W REFLEX MICROSCOPIC
Bacteria, UA: NONE SEEN
Bilirubin Urine: NEGATIVE
GLUCOSE, UA: NEGATIVE mg/dL
Ketones, ur: NEGATIVE mg/dL
Leukocytes, UA: NEGATIVE
NITRITE: NEGATIVE
PH: 6 (ref 5.0–8.0)
Protein, ur: NEGATIVE mg/dL
SPECIFIC GRAVITY, URINE: 1.024 (ref 1.005–1.030)

## 2016-03-15 LAB — CBC WITH DIFFERENTIAL/PLATELET
Basophils Absolute: 0 10*3/uL (ref 0.0–0.1)
Basophils Relative: 0 %
EOS ABS: 0.3 10*3/uL (ref 0.0–0.7)
Eosinophils Relative: 4 %
HCT: 38.8 % (ref 36.0–46.0)
HEMOGLOBIN: 13.1 g/dL (ref 12.0–15.0)
Lymphocytes Relative: 39 %
Lymphs Abs: 2.7 10*3/uL (ref 0.7–4.0)
MCH: 30.6 pg (ref 26.0–34.0)
MCHC: 33.8 g/dL (ref 30.0–36.0)
MCV: 90.7 fL (ref 78.0–100.0)
MONO ABS: 0.4 10*3/uL (ref 0.1–1.0)
MONOS PCT: 6 %
NEUTROS PCT: 51 %
Neutro Abs: 3.5 10*3/uL (ref 1.7–7.7)
Platelets: 242 10*3/uL (ref 150–400)
RBC: 4.28 MIL/uL (ref 3.87–5.11)
RDW: 13.2 % (ref 11.5–15.5)
WBC: 6.9 10*3/uL (ref 4.0–10.5)

## 2016-03-15 LAB — COMPREHENSIVE METABOLIC PANEL
ALK PHOS: 48 U/L (ref 38–126)
ALT: 12 U/L — ABNORMAL LOW (ref 14–54)
ANION GAP: 6 (ref 5–15)
AST: 17 U/L (ref 15–41)
Albumin: 3.5 g/dL (ref 3.5–5.0)
BILIRUBIN TOTAL: 0.8 mg/dL (ref 0.3–1.2)
BUN: 8 mg/dL (ref 6–20)
CALCIUM: 9.2 mg/dL (ref 8.9–10.3)
CO2: 22 mmol/L (ref 22–32)
Chloride: 109 mmol/L (ref 101–111)
Creatinine, Ser: 0.74 mg/dL (ref 0.44–1.00)
GFR calc non Af Amer: >60 mL/min (ref >60)
Glucose, Bld: 96 mg/dL (ref 65–99)
Potassium: 3.8 mmol/L (ref 3.5–5.1)
SODIUM: 137 mmol/L (ref 135–145)
TOTAL PROTEIN: 7.2 g/dL (ref 6.5–8.1)

## 2016-03-15 LAB — I-STAT BETA HCG BLOOD, ED (MC, WL, AP ONLY): I-stat hCG, quantitative: <5 m[IU]/mL (ref <5)

## 2016-03-15 LAB — LIPASE, BLOOD: Lipase: 35 U/L (ref 11–51)

## 2016-03-15 MED ORDER — SODIUM CHLORIDE 0.9 % IV BOLUS (SEPSIS): 500.0000 mL | Freq: Once | INTRAVENOUS | Status: DC

## 2016-03-15 MED ORDER — ONDANSETRON 4 MG PO TBDP
4.0000 mg | ORAL_TABLET | Freq: Once | ORAL | Status: AC
  Administered 2016-03-15: 4 mg via ORAL
  Filled 2016-03-15: qty 1

## 2016-03-15 MED ORDER — ONDANSETRON HCL 4 MG/2ML IJ SOLN
4.0000 mg | Freq: Once | INTRAMUSCULAR | Status: DC
  Filled 2016-03-15: qty 2

## 2016-03-15 MED ORDER — DICYCLOMINE HCL 20 MG PO TABS: 20.0000 mg | ORAL_TABLET | Freq: Two times a day (BID) | ORAL | 0 refills | Status: DC

## 2016-03-15 MED ORDER — ONDANSETRON HCL 4 MG PO TABS: 4.0000 mg | ORAL_TABLET | Freq: Three times a day (TID) | ORAL | 0 refills | Status: DC | PRN

## 2016-03-15 MED ORDER — LOPERAMIDE HCL 2 MG PO CAPS: 2.0000 mg | ORAL_CAPSULE | Freq: Four times a day (QID) | ORAL | 0 refills | Status: DC | PRN

## 2016-03-15 NOTE — ED Provider Notes (Signed)
[facility]-EM[facility]ENCY DEPT Provider Note   CSN: [id number] Arrival date & time: 03/15/16  1048     History   Chief Complaint Chief Complaint  Patient presents with  . Emesis  . Generalized Body Aches    HPI Jill Rush is a 26 y.o. female   Jill Rush is a 26 y.o. female with complaint of gastrointestinal symptoms of watery diarrhea, lower abdominal cramps, upper abdominal cramps, nausea, vomiting for 3 days. No blood in stool. She had coworkers with the same sxs last week . She denies ingestion of suspect foods or water, history of similar sxs, recent foreign travel . She has no previous surgical hx. The patient denies any Current abdominal pain. She did not take any medications prior to arrival because she states "I did not know what to take."  HPI  Past Medical History:  Diagnosis Date  . No pertinent past medical history     Patient Active Problem List   Diagnosis Date Noted  . Gastroenteritis 07/22/2014  . UTI (urinary tract infection) 09/29/2013  . Flu-like symptoms 04/02/2013  . Dermatitis 06/03/2012  . BCP (birth control pills) initiation 01/23/2011    History reviewed. No pertinent surgical history.  OB History    Gravida Para Term Preterm AB Living   1       1     SAB TAB Ectopic Multiple Live Births     1             Home Medications    Prior to Admission medications   Medication Sig Start Date End Date Taking? Authorizing Provider  acyclovir (ZOVIRAX) 400 MG tablet Take 1 tablet (400 mg total) by mouth 3 (three) times daily. Patient not taking: Reported on 03/15/2016 08/15/15   Carney Living, MD  dicyclomine (BENTYL) 20 MG tablet Take 1 tablet (20 mg total) by mouth 2 (two) times daily. 03/15/16   Arthor Captain, PA-C  HYDROcodone-acetaminophen (NORCO/VICODIN) 5-325 MG tablet Take 2 tablets by mouth every 4 (four) hours as needed. Patient not taking: Reported on 03/15/2016 02/28/15   Nelva Nay, MD  loperamide (IMODIUM) 2 MG capsule Take  1 capsule (2 mg total) by mouth 4 (four) times daily as needed for diarrhea or loose stools. 03/15/16   Arthor Captain, PA-C  naproxen (NAPROSYN) 500 MG tablet Take 1 tablet (500 mg total) by mouth 2 (two) times daily. Patient not taking: Reported on 03/15/2016 11/06/15   Gwyneth Sprout, MD  ondansetron (ZOFRAN) 4 MG tablet Take 1 tablet (4 mg total) by mouth every 8 (eight) hours as needed for nausea or vomiting. 03/15/16   Arthor Captain, PA-C    Family History Family History  Problem Relation Age of Onset  . Hypertension Mother   . Diabetes Sister     with pregnancy    Social History Social History  Substance Use Topics  . Smoking status: Never Smoker  . Smokeless tobacco: Never Used  . Alcohol use 0.5 oz/week    1 Standard drinks or equivalent per week     Allergies   Patient has no known allergies.   Review of Systems Review of Systems  Ten systems reviewed and are negative for acute change, except as noted in the HPI.   Physical Exam Updated Vital Signs BP 103/76   Pulse 70   Temp 98.7 F (37.1 C) (Oral)   Resp 17   Ht 5' (1.524 m)   Wt 82.3 kg   SpO2 98%   BMI 35.45 kg/m  Physical Exam  Constitutional: She is oriented to person, place, and time. She appears well-developed and well-nourished. No distress.  HENT:  Head: Normocephalic and atraumatic.  Eyes: Conjunctivae and EOM are normal. Pupils are equal, round, and reactive to light. No scleral icterus.  Neck: Normal range of motion.  Cardiovascular: Normal rate, regular rhythm and normal heart sounds.  Exam reveals no gallop and no friction rub.   No murmur heard. Pulmonary/Chest: Effort normal and breath sounds normal. No respiratory distress.  Abdominal: Soft. Bowel sounds are normal. She exhibits no distension and no mass. There is no tenderness. There is no guarding.  Neurological: She is alert and oriented to person, place, and time.  Skin: Skin is warm and dry. She is not diaphoretic.  Nursing  note and vitals reviewed.    ED Treatments / Results  Labs (all labs ordered are listed, but only abnormal results are displayed) Labs Reviewed  COMPREHENSIVE METABOLIC PANEL - Abnormal; Notable for the following:       Result Value   ALT 12 (*)    All other components within normal limits  URINALYSIS, ROUTINE W REFLEX MICROSCOPIC - Abnormal; Notable for the following:    Hgb urine dipstick SMALL (*)    Squamous Epithelial / LPF 0-5 (*)    All other components within normal limits  CBC WITH DIFFERENTIAL/PLATELET  LIPASE, BLOOD  I-STAT BETA HCG BLOOD, ED ([facility], [facility], [facility] ONLY)    EKG  EKG Interpretation None       Radiology No results found.  Procedures Procedures (including critical care time)  Medications Ordered in ED Medications  ondansetron (ZOFRAN-ODT) disintegrating tablet 4 mg (4 mg Oral Given 03/15/16 1205)     Initial Impression / Assessment and Plan / ED Course  I have reviewed the triage vital signs and the nursing notes.  Pertinent labs & imaging results that were available during my care of the patient were reviewed by me and considered in my medical decision making (see chart for details).     Patient with symptoms consistent with viral gastroenteritis.  Vitals are stable, no fever.  No signs of dehydration, tolerating PO fluids > 6 oz.  Lungs are clear.  No focal abdominal pain, no concern for appendicitis, cholecystitis, pancreatitis, ruptured viscus, UTI, kidney stone, or any other abdominal etiology.  Supportive therapy indicated with return if symptoms worsen.  Patient counseled.   Final Clinical Impressions(s) / ED Diagnoses   Final diagnoses:  Nausea vomiting and diarrhea    New Prescriptions New Prescriptions   DICYCLOMINE (BENTYL) 20 MG TABLET    Take 1 tablet (20 mg total) by mouth 2 (two) times daily.   LOPERAMIDE (IMODIUM) 2 MG CAPSULE    Take 1 capsule (2 mg total) by mouth 4 (four) times daily as needed for diarrhea or loose stools.    ONDANSETRON (ZOFRAN) 4 MG TABLET    Take 1 tablet (4 mg total) by mouth every 8 (eight) hours as needed for nausea or vomiting.     Arthor Captainbigail Glendoris Nodarse, PA-C 03/15/16 1428    Doug SouSam Jacubowitz, MD 03/15/16 [phone]

## 2016-03-15 NOTE — ED Notes (Signed)
Pt given cup of water 

## 2016-03-15 NOTE — Discharge Instructions (Signed)
In th future you may try Dramamine (makes you sleepy), meclizine (does not make you sleepy) for nausea, tylenol or motrin, and imodium for diarrhea.   Get help right away if:  You have chest pain.  You feel extremely weak or you faint.  You see blood in your vomit.  Your vomit looks like coffee grounds.  You have bloody or black stools or stools that look like tar.  You have a severe headache, a stiff neck, or both.  You have a rash.  You have severe pain, cramping, or bloating in your abdomen.  You have trouble breathing or you are breathing very quickly.  Your heart is beating very quickly.  Your skin feels cold and clammy.  You feel confused.  You have pain when you urinate.  You have signs of dehydration, such as: ? Dark urine, very little urine, or no urine. ? Cracked lips. ? Dry mouth. ? Sunken eyes. ? Sleepiness. ? Weakness.

## 2016-03-15 NOTE — ED Notes (Signed)
Pt A&OX4, ambulatory at d/c with steady gait, NAD and states she has all of her belongings with her at d/c 

## 2016-03-15 NOTE — ED Triage Notes (Signed)
Pt here for N/V/D and body aches x 2 days

## 2016-03-15 NOTE — ED Notes (Signed)
Pt politely reports she does not want an IV and states she would like oral fluids and PO ODT Zofran. Cammy CopaAbigail, PA informed.

## 2016-04-26 ENCOUNTER — Ambulatory Visit (INDEPENDENT_AMBULATORY_CARE_PROVIDER_SITE_OTHER): Payer: Self-pay | Admitting: Internal Medicine

## 2016-04-26 VITALS — BP 110/82 | HR 68 | Temp 98.0°F | Ht 60.0 in | Wt 177.0 lb

## 2016-04-26 DIAGNOSIS — Z3009 Encounter for other general counseling and advice on contraception: Secondary | ICD-10-CM

## 2016-04-26 DIAGNOSIS — Z30011 Encounter for initial prescription of contraceptive pills: Secondary | ICD-10-CM

## 2016-04-26 LAB — POCT URINE PREGNANCY: PREG TEST UR: NEGATIVE

## 2016-04-26 MED ORDER — NORGESTIM-ETH ESTRAD TRIPHASIC 0.18/0.215/0.25 MG-35 MCG PO TABS: 1.0000 | ORAL_TABLET | Freq: Every day | ORAL | 11 refills | Status: DC

## 2016-04-26 NOTE — Progress Notes (Signed)
   [facility] Noralee CharsAsiyah Kalaysia Demonbreun, MD Phone: [phone]  Reason For Visit: Contraception Management   # Here for contraception. Discussed different agents with patient. Would like to try pills. - No history of autoimmune diseases - No history of clots, family history of clots - Does not smoke  - No history of migraines with aura - No history of hypertension - No vaginal discharge/ No STD concerns - Menstrual period two weeks ago   - Previously using condoms for contraception    Past Medical History Reviewed problem list.  Medications- reviewed and updated No additions to family history Social history- patient is a non- smoker  Objective: BP 110/82   Pulse 68   Temp 98 F (36.7 C) (Oral)   Ht 5' (1.524 m)   Wt 177 lb (80.3 kg)   LMP 04/08/2016   BMI 34.57 kg/m  Gen: NAD, alert, cooperative with exam Cardio: regular rate and rhythm, S1S2 heard, no murmurs appreciated Pulm: clear to auscultation bilaterally, no wheezes, rhonchi or rales Skin: dry, intact, no rashes or lesions  Assessment/Plan: See problem based a/p BCP (birth control pills) initiation Counseled patient about birth control options, wanted to try pills. No red flags noted. Negative pregnancy test.  Meds ordered this encounter  Medications  . Norgestimate-Ethinyl Estradiol Triphasic (ORTHO TRI-CYCLEN, 28,) 0.18/0.215/0.25 MG-35 MCG tablet    Sig: Take 1 tablet by mouth daily.    Dispense:  1 Package    Refill:  11

## 2016-04-26 NOTE — Progress Notes (Signed)
Urine

## 2016-04-26 NOTE — Patient Instructions (Signed)
You will need to follow up in 6-8 months for pap smear. Otherwise if you have an issues with this medication please let me know.

## 2016-05-02 NOTE — Assessment & Plan Note (Signed)
Counseled patient about birth control options, wanted to try pills. No red flags noted. Negative pregnancy test.  Meds ordered this encounter  Medications  . Norgestimate-Ethinyl Estradiol Triphasic (ORTHO TRI-CYCLEN, 28,) 0.18/0.215/0.25 MG-35 MCG tablet    Sig: Take 1 tablet by mouth daily.    Dispense:  1 Package    Refill:  11

## 2017-06-04 ENCOUNTER — Telehealth: Payer: Self-pay

## 2017-06-04 NOTE — Telephone Encounter (Signed)
Pt left message on nurse line requesting refill request, did not leave medication or pharmacy. Pt has not been seen in over a year. Left message for patient to call back and schedule appt in order to have prescriptions refilled. Shawna OrleansMeredith B Pooja Camuso, RN

## 2017-06-11 ENCOUNTER — Other Ambulatory Visit: Payer: Self-pay

## 2017-06-11 ENCOUNTER — Ambulatory Visit (INDEPENDENT_AMBULATORY_CARE_PROVIDER_SITE_OTHER): Payer: Medicaid Other | Admitting: Family Medicine

## 2017-06-11 ENCOUNTER — Encounter: Payer: Self-pay | Admitting: Family Medicine

## 2017-06-11 VITALS — BP 122/72 | HR 83 | Temp 98.5°F | Ht 60.0 in | Wt 176.0 lb

## 2017-06-11 DIAGNOSIS — Z3A01 Less than 8 weeks gestation of pregnancy: Secondary | ICD-10-CM | POA: Diagnosis not present

## 2017-06-11 DIAGNOSIS — N912 Amenorrhea, unspecified: Secondary | ICD-10-CM

## 2017-06-11 DIAGNOSIS — Z3490 Encounter for supervision of normal pregnancy, unspecified, unspecified trimester: Secondary | ICD-10-CM | POA: Insufficient documentation

## 2017-06-11 LAB — POCT URINALYSIS DIP (MANUAL ENTRY)
Bilirubin, UA: NEGATIVE
GLUCOSE UA: NEGATIVE mg/dL
Ketones, POC UA: NEGATIVE mg/dL
Leukocytes, UA: NEGATIVE
NITRITE UA: NEGATIVE
RBC UA: NEGATIVE
Spec Grav, UA: 1.02 (ref 1.010–1.025)
UROBILINOGEN UA: 1 U/dL
pH, UA: 7 (ref 5.0–8.0)

## 2017-06-11 LAB — POCT URINE PREGNANCY: Preg Test, Ur: POSITIVE — AB

## 2017-06-11 MED ORDER — PRENATAL VITAMIN AND MINERAL 28-0.8 MG PO TABS
1.0000 | ORAL_TABLET | Freq: Every day | ORAL | 1 refills | Status: DC
Start: 2017-06-11 — End: 2017-07-04

## 2017-06-11 NOTE — Progress Notes (Signed)
Subjective  Jill InchDominique M Sperry is a 27 y.o. female is presenting with the following  AMENORRHEA Last menstrual periods on 05/06/17.  Usually has very regular menstrual periods.  No contraception for at least 6 months (not sure when stopped)  Not wanting to prevent pregnancy but not sure wanted to be pregnant.  Her boyfriend has a daughter and desires pregnancy.  She is over all excited about being pregnant.   Has never been before Has had a few episodes of nausea without vomiting and breast tenderness  No medications other than as needed acyclovir.  No smoking or alcohol.   Not taking prenatal vits   Chief Complaint noted Review of Symptoms - see HPI PMH - Smoking status noted.    Objective Vital Signs reviewed BP 122/72   Pulse 83   Temp 98.5 F (36.9 C) (Oral)   Ht 5' (1.524 m)   Wt 176 lb (79.8 kg)   LMP 05/06/2017   SpO2 99%   BMI 34.37 kg/m  Alert nad Heart - Regular rate and rhythm.  No murmurs, gallops or rubs.    Lungs:  Normal respiratory effort, chest expands symmetrically. Lungs are clear to auscultation, no crackles or wheezes. Abdomen: soft and non-tender without masses, organomegaly or hernias noted.  No guarding or rebound Extremities:  No cyanosis, edema, or deformity noted with good range of motion of all major joints.    Assessments/Plans  See after visit summary for details of patient instuctions  Pregnancy Seems around 5 weeks. Will check labs and setup for new OB appointment

## 2017-06-11 NOTE — Assessment & Plan Note (Signed)
Seems around 5 weeks. Will check labs and setup for new OB appointment

## 2017-06-11 NOTE — Patient Instructions (Signed)
Good to see you today!  Thanks for coming in.  Avoid any exposure to harmful substances - alcohol, drugs, fumes or over the counter medications  Take a prenatal vitamin every day  Make an appointment for a new OB visit

## 2017-06-12 LAB — OBSTETRIC PANEL, INCLUDING HIV
ANTIBODY SCREEN: NEGATIVE
BASOS: 0 %
Basophils Absolute: 0 10*3/uL (ref 0.0–0.2)
EOS (ABSOLUTE): 0.1 10*3/uL (ref 0.0–0.4)
EOS: 2 %
HEMATOCRIT: 36.5 % (ref 34.0–46.6)
HEMOGLOBIN: 12.7 g/dL (ref 11.1–15.9)
HIV SCREEN 4TH GENERATION: NONREACTIVE
Hepatitis B Surface Ag: NEGATIVE
IMMATURE GRANS (ABS): 0 10*3/uL (ref 0.0–0.1)
Immature Granulocytes: 0 %
LYMPHS: 47 %
Lymphocytes Absolute: 2.9 10*3/uL (ref 0.7–3.1)
MCH: 31.1 pg (ref 26.6–33.0)
MCHC: 34.8 g/dL (ref 31.5–35.7)
MCV: 90 fL (ref 79–97)
MONOS ABS: 0.3 10*3/uL (ref 0.1–0.9)
Monocytes: 5 %
NEUTROS ABS: 2.9 10*3/uL (ref 1.4–7.0)
Neutrophils: 46 %
Platelets: 318 10*3/uL (ref 150–379)
RBC: 4.08 x10E6/uL (ref 3.77–5.28)
RDW: 13.2 % (ref 12.3–15.4)
RH TYPE: POSITIVE
RPR Ser Ql: NONREACTIVE
RUBELLA: 6.93 {index} (ref >0.99)
WBC: 6.3 10*3/uL (ref 3.4–10.8)

## 2017-06-12 LAB — SICKLE CELL SCREEN: Sickle Cell Screen: NEGATIVE

## 2017-06-13 LAB — CULTURE, OB URINE

## 2017-06-13 LAB — URINE CULTURE, OB REFLEX

## 2017-06-18 ENCOUNTER — Encounter: Payer: Self-pay | Admitting: Internal Medicine

## 2017-06-18 ENCOUNTER — Other Ambulatory Visit (HOSPITAL_COMMUNITY)
Admission: RE | Admit: 2017-06-18 | Discharge: 2017-06-18 | Disposition: A | Discharge status: Home / Self Care | Payer: Medicaid Other | Source: Ambulatory Visit | Attending: Family Medicine | Admitting: Family Medicine

## 2017-06-18 ENCOUNTER — Ambulatory Visit (INDEPENDENT_AMBULATORY_CARE_PROVIDER_SITE_OTHER): Payer: Medicaid Other | Admitting: Internal Medicine

## 2017-06-18 ENCOUNTER — Other Ambulatory Visit: Payer: Self-pay

## 2017-06-18 VITALS — BP 108/70 | HR 99 | Temp 98.5°F | Wt 175.0 lb

## 2017-06-18 DIAGNOSIS — Z3401 Encounter for supervision of normal first pregnancy, first trimester: Secondary | ICD-10-CM

## 2017-06-18 DIAGNOSIS — Z3491 Encounter for supervision of normal pregnancy, unspecified, first trimester: Secondary | ICD-10-CM | POA: Insufficient documentation

## 2017-06-18 LAB — POCT 1 HR PRENATAL GLUCOSE: Glucose 1 Hr Prenatal, POC: 107 mg/dL

## 2017-06-18 NOTE — Addendum Note (Signed)
Addended by: Jennette BillBUSICK, Sanoe Hazan L on: 06/18/2017 04:19 PM   Modules accepted: Orders

## 2017-06-18 NOTE — Patient Instructions (Addendum)
Thank you for coming:  1) your next visit should be in 4 weeks  2) continue to take your prenatal vitamin 3) we need to schedule for the diabetes screening (1hr glucola)  4) we did a pap smear and screened for STI  5) we will get an ultrasound to verify how far along you are  6) We will schedule the genetic screen: this will be in a few weeks (between 11 to 13 weeks of pregnancy)   First Trimester of Pregnancy The first trimester of pregnancy is from week 1 until the end of week 13 (months 1 through 3). During this time, your baby will begin to develop inside you. At 6-8 weeks, the eyes and face are formed, and the heartbeat can be seen on ultrasound. At the end of 12 weeks, all the baby's organs are formed. Prenatal care is all the medical care you receive before the birth of your baby. Make sure you get good prenatal care and follow all of your doctor's instructions. Follow these instructions at home: Medicines  Take over-the-counter and prescription medicines only as told by your doctor. Some medicines are safe and some medicines are not safe during pregnancy.  Take a prenatal vitamin that contains at least 600 micrograms (mcg) of folic acid.  If you have trouble pooping (constipation), take medicine that will make your stool soft (stool softener) if your doctor approves. Eating and drinking  Eat regular, healthy meals.  Your doctor will tell you the amount of weight gain that is right for you.  Avoid raw meat and uncooked cheese.  If you feel sick to your stomach (nauseous) or throw up (vomit): ? Eat 4 or 5 small meals a day instead of 3 large meals. ? Try eating a few soda crackers. ? Drink liquids between meals instead of during meals.  To prevent constipation: ? Eat foods that are high in fiber, like fresh fruits and vegetables, whole grains, and beans. ? Drink enough fluids to keep your pee (urine) clear or pale yellow. Activity  Exercise only as told by your doctor. Stop  exercising if you have cramps or pain in your lower belly (abdomen) or low back.  Do not exercise if it is too hot, too humid, or if you are in a place of great height (high altitude).  Try to avoid standing for long periods of time. Move your legs often if you must stand in one place for a long time.  Avoid heavy lifting.  Wear low-heeled shoes. Sit and stand up straight.  You can have sex unless your doctor tells you not to. Relieving pain and discomfort  Wear a good support bra if your breasts are sore.  Take warm water baths (sitz baths) to soothe pain or discomfort caused by hemorrhoids. Use hemorrhoid cream if your doctor says it is okay.  Rest with your legs raised if you have leg cramps or low back pain.  If you have puffy, bulging veins (varicose veins) in your legs: ? Wear support hose or compression stockings as told by your doctor. ? Raise (elevate) your feet for 15 minutes, 3-4 times a day. ? Limit salt in your food. Prenatal care  Schedule your prenatal visits by the twelfth week of pregnancy.  Write down your questions. Take them to your prenatal visits.  Keep all your prenatal visits as told by your doctor. This is important. Safety  Wear your seat belt at all times when driving.  Make a list of emergency phone  numbers. The list should include numbers for family, friends, the hospital, and police and fire departments. General instructions  Ask your doctor for a referral to a local prenatal class. Begin classes no later than at the start of month 6 of your pregnancy.  Ask for help if you need counseling or if you need help with nutrition. Your doctor can give you advice or tell you where to go for help.  Do not use hot tubs, steam rooms, or saunas.  Do not douche or use tampons or scented sanitary pads.  Do not cross your legs for long periods of time.  Avoid all herbs and alcohol. Avoid drugs that are not approved by your doctor.  Do not use any  tobacco products, including cigarettes, chewing tobacco, and electronic cigarettes. If you need help quitting, ask your doctor. You may get counseling or other support to help you quit.  Avoid cat litter boxes and soil used by cats. These carry germs that can cause birth defects in the baby and can cause a loss of your baby (miscarriage) or stillbirth.  Visit your dentist. At home, brush your teeth with a soft toothbrush. Be gentle when you floss. Contact a doctor if:  You are dizzy.  You have mild cramps or pressure in your lower belly.  You have a nagging pain in your belly area.  You continue to feel sick to your stomach, you throw up, or you have watery poop (diarrhea).  You have a bad smelling fluid coming from your vagina.  You have pain when you pee (urinate).  You have increased puffiness (swelling) in your face, hands, legs, or ankles. Get help right away if:  You have a fever.  You are leaking fluid from your vagina.  You have spotting or bleeding from your vagina.  You have very bad belly cramping or pain.  You gain or lose weight rapidly.  You throw up blood. It may look like coffee grounds.  You are around people who have [place] measles, fifth disease, or chickenpox.  You have a very bad headache.  You have shortness of breath.  You have any kind of trauma, such as from a fall or a car accident. Summary  The first trimester of pregnancy is from week 1 until the end of week 13 (months 1 through 3).  To take care of yourself and your unborn baby, you will need to eat healthy meals, take medicines only if your doctor tells you to do so, and do activities that are safe for you and your baby.  Keep all follow-up visits as told by your doctor. This is important as your doctor will have to ensure that your baby is healthy and growing well. This information is not intended to replace advice given to you by your health care provider. Make sure you discuss any  questions you have with your health care provider. Document Released: 08/01/2007 Document Revised: 02/21/2016 Document Reviewed: 02/21/2016 Elsevier Interactive Patient Education  2017 [employer].

## 2017-06-18 NOTE — Progress Notes (Addendum)
Jill InchDominique M Rush is a 27 y.o. yo G1P0 at 6710w5d by LMP who presents for her initial prenatal visit. Pregnancy  is not planned, but welcomed She reports no symptoms . She  isTaking PNV. See flow sheet for details.  PMH, POBH, FH, meds, allergies and Social Hx reviewed.  Prenatal exam:Gen: Well nourished, well developed.  No distress.  Vitals noted. HEENT: Normocephalic, atraumatic.  Neck supple without cervical lymphadenopathy, thyromegaly or thyroid nodules.  fair dentition. CV: RRR no murmur, gallops or rubs Lungs: CTA B.  Normal respiratory effort without wheezes or rales. Abd: soft, NTND. +BS.  Uterus not appreciated above pelvis. GU: Normal external female genitalia without lesions.  Nl vaginal, well rugated without lesions. No vaginal discharge.  Bimanual exam: No adnexal mass or TTP. No CMT.   Ext: No clubbing, cyanosis or edema. Psych: Normal grooming and dress.  Not depressed or anxious appearing.  Normal thought content and process without flight of ideas or looseness of associations    Assessment/plan: 1) Pregnancy Unknown doing well.  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 Dating ultrasound ordered Prenatal labs reviewed.  Pap smear, Gc/chlamydia testing, and RPR.  Bleeding and pain precautions reviewed. Importance of prenatal vitamins reviewed.  Genetic screening offered and ordered integrated screen.  Early glucola is indicated. Ordered   PMH Form completed   Follow up 4 weeks.

## 2017-06-19 LAB — CERVICOVAGINAL ANCILLARY ONLY
Chlamydia: NEGATIVE
Neisseria Gonorrhea: NEGATIVE
Trichomonas: POSITIVE — AB

## 2017-06-19 LAB — RPR: RPR Ser Ql: NONREACTIVE

## 2017-06-20 ENCOUNTER — Telehealth: Payer: Self-pay | Admitting: Family Medicine

## 2017-06-20 ENCOUNTER — Encounter: Payer: Self-pay | Admitting: Internal Medicine

## 2017-06-20 ENCOUNTER — Telehealth: Payer: Self-pay | Admitting: Internal Medicine

## 2017-06-20 ENCOUNTER — Ambulatory Visit: Payer: Medicaid Other

## 2017-06-20 DIAGNOSIS — A599 Trichomoniasis, unspecified: Secondary | ICD-10-CM

## 2017-06-20 LAB — CYTOLOGY - PAP
Adequacy: ABSENT
DIAGNOSIS: NEGATIVE

## 2017-06-20 MED ORDER — METRONIDAZOLE 500 MG PO TABS
2000.0000 mg | ORAL_TABLET | Freq: Once | ORAL | Status: AC
  Administered 2017-06-20: 2000 mg via ORAL

## 2017-06-20 NOTE — Progress Notes (Signed)
Patient in nurse clinic today for STD treatment of trichomonas. Patient advised to abstain from sex for 7-10 days after treatment or when partner has been tested/treated.  Metronidazole 2g PO x 1 given per Dr. Deland PrettyGunadasa's orders.  Patient to follow up in 2-3 months for re-screening.  Advised to use condoms with all sexual activity.   Patient verbalized understanding.  Shawna OrleansMeredith B Thomsen, RN

## 2017-06-20 NOTE — Telephone Encounter (Signed)
Attempted to call mobile phone (preferred number). Went to [employer]. Left message to call back.

## 2017-06-20 NOTE — Telephone Encounter (Signed)
Pt returning Jill Rush's phone call. Please call pt back.

## 2017-06-20 NOTE — Telephone Encounter (Signed)
Will forward to Dr. Gunadasa. Jazmin Hartsell,CMA  

## 2017-06-20 NOTE — Telephone Encounter (Signed)
Called patient to inform of positive Trichomonas testing. Made a nurse visit for today for treatment with Flagyl 2g PO X1. Will forward to nursing pool to inform. Counseled on abstaining from intercourse for 7 days after tx and until partner is tested/treated.

## 2017-06-20 NOTE — Progress Notes (Signed)
Will send mychart message with normal pap.

## 2017-06-24 ENCOUNTER — Ambulatory Visit (HOSPITAL_COMMUNITY)
Admission: RE | Admit: 2017-06-24 | Discharge: 2017-06-24 | Disposition: A | Discharge status: Home / Self Care | Payer: Medicaid Other | Source: Ambulatory Visit | Attending: Family Medicine | Admitting: Family Medicine

## 2017-06-24 DIAGNOSIS — Z3A01 Less than 8 weeks gestation of pregnancy: Secondary | ICD-10-CM | POA: Diagnosis not present

## 2017-06-24 DIAGNOSIS — Z3401 Encounter for supervision of normal first pregnancy, first trimester: Secondary | ICD-10-CM

## 2017-06-24 DIAGNOSIS — Z3491 Encounter for supervision of normal pregnancy, unspecified, first trimester: Secondary | ICD-10-CM

## 2017-06-24 DIAGNOSIS — Z3687 Encounter for antenatal screening for uncertain dates: Secondary | ICD-10-CM | POA: Insufficient documentation

## 2017-06-25 ENCOUNTER — Telehealth: Payer: Self-pay | Admitting: Internal Medicine

## 2017-06-25 ENCOUNTER — Other Ambulatory Visit: Payer: Self-pay

## 2017-06-25 DIAGNOSIS — Z3491 Encounter for supervision of normal pregnancy, unspecified, first trimester: Secondary | ICD-10-CM

## 2017-06-25 NOTE — Telephone Encounter (Signed)
Pt called and made a lab appt for this Thursday, 06-27-2017 for the serum beta hcg. Just letting Gunadasa know in case orders needed to be put in.

## 2017-06-25 NOTE — Telephone Encounter (Signed)
Called patient to inform of ultrasound results. Embryo is not visualized. Recommend repeat US in 10 days along with serum beta hcg.  1) asked patient to make a lab visit this week to get serum beta hcg drawn 2) Blue team, please set up an [place] appointment in about 10 days from last (will be around next Thursday) 3) Please let me know when this appointment is made so that I can order another serum beta hcg for the same day.

## 2017-06-25 NOTE — Telephone Encounter (Signed)
Noted  

## 2017-06-27 ENCOUNTER — Other Ambulatory Visit: Payer: Self-pay | Admitting: Internal Medicine

## 2017-06-27 ENCOUNTER — Other Ambulatory Visit: Payer: Medicaid Other

## 2017-06-27 DIAGNOSIS — Z3491 Encounter for supervision of normal pregnancy, unspecified, first trimester: Secondary | ICD-10-CM

## 2017-06-27 MED ORDER — VITAMIN B-6 25 MG PO TABS: 25.0000 mg | ORAL_TABLET | Freq: Three times a day (TID) | ORAL | 0 refills | Status: DC | PRN

## 2017-06-27 NOTE — Progress Notes (Signed)
Was seen in lab today. Requested medication for nausea. Will send Pyridoxine

## 2017-06-27 NOTE — Progress Notes (Signed)
BETA

## 2017-06-28 LAB — BETA HCG QUANT (REF LAB): hCG Quant: 20432 m[IU]/mL

## 2017-07-01 NOTE — Telephone Encounter (Signed)
Just rerouting to ensure that you saw my message about setting up and [place] appointment for patient for this week. The order is already in.

## 2017-07-01 NOTE — Telephone Encounter (Signed)
MFM does not do FU of any kind... Just place an order for non mfm.

## 2017-07-02 ENCOUNTER — Other Ambulatory Visit: Payer: Self-pay | Admitting: Internal Medicine

## 2017-07-02 ENCOUNTER — Telehealth: Payer: Self-pay | Admitting: Family Medicine

## 2017-07-02 DIAGNOSIS — Z3491 Encounter for supervision of normal pregnancy, unspecified, first trimester: Secondary | ICD-10-CM

## 2017-07-02 NOTE — Addendum Note (Signed)
Addended by: Palma Holter on: 07/02/2017 08:43 AM   Modules accepted: Orders

## 2017-07-02 NOTE — Telephone Encounter (Signed)
LM for patient with information of her appointments for next week. Jill Rush,CMA

## 2017-07-02 NOTE — Progress Notes (Signed)
Ordered repeat blood test to follow hormone level (so we can compare to prior value).

## 2017-07-02 NOTE — Telephone Encounter (Signed)
Pt would like to know her test results from her blood work last week. Pt also asked when someone would be contacting her about her ultrasound. Please call pt to discuss.

## 2017-07-02 NOTE — Telephone Encounter (Signed)
Ordered the follow up ultrasound. When this is scheduled. Please inform patient. Also inform her that we need to repeat the blood test that she had done to follow the trend (she should make a lab visit for this). I will order this under orders only encounter.

## 2017-07-03 ENCOUNTER — Telehealth: Payer: Self-pay | Admitting: Family Medicine

## 2017-07-03 NOTE — Telephone Encounter (Signed)
Called patient to discuss.  Wrote a paper prescription for patient's partner for Trichomonas, Flagyl 2g PO x 1.  Partner: Sheppard Coil 05/03/1991

## 2017-07-03 NOTE — Telephone Encounter (Signed)
Pt called concerning the medication she was given at her appt for her infection. Pt would like a print out of the medication she was given so she can give it to her partner so he can get that same medication. Please advise

## 2017-07-04 ENCOUNTER — Telehealth: Payer: Self-pay

## 2017-07-04 MED ORDER — FLINSTONES GUMMIES OMEGA-3 DHA PO CHEW: 2.0000 | CHEWABLE_TABLET | Freq: Every day | ORAL | 3 refills | Status: DC

## 2017-07-04 NOTE — Telephone Encounter (Signed)
Called patient and gave her information on RX that she was given.  She stated that she had already been called.  Glennie Hawk, CMA

## 2017-07-04 NOTE — Telephone Encounter (Signed)
She can take Flinstone gummy vitamin 2 gummies daily instead of the prenatal vitamin. Attempted to call but went to voicemail. Left message

## 2017-07-04 NOTE — Telephone Encounter (Signed)
Patient left message on nurse line asking for a different prenatal vitamin to be prescribed. States hers is causing nausea.  Call back is [phone].  Ples Specter, RN [facility] RN)

## 2017-07-04 NOTE — Telephone Encounter (Signed)
Patient wants a print out of the medication that she was given for her infection so that her partner can take the same medication. Please advise  .Glennie Hawk, CMA

## 2017-07-04 NOTE — Telephone Encounter (Signed)
Please let her know she was given   Flagyl  Also known as metronidazole by Dr Ottie Glazier   Thanks  LC

## 2017-07-10 ENCOUNTER — Ambulatory Visit (HOSPITAL_COMMUNITY)
Admission: RE | Admit: 2017-07-10 | Discharge: 2017-07-10 | Disposition: A | Discharge status: Home / Self Care | Payer: Medicaid Other | Source: Ambulatory Visit | Attending: Family Medicine | Admitting: Family Medicine

## 2017-07-10 ENCOUNTER — Other Ambulatory Visit: Payer: Medicaid Other

## 2017-07-10 DIAGNOSIS — O021 Missed abortion: Secondary | ICD-10-CM | POA: Insufficient documentation

## 2017-07-10 DIAGNOSIS — Z3A01 Less than 8 weeks gestation of pregnancy: Secondary | ICD-10-CM | POA: Insufficient documentation

## 2017-07-10 DIAGNOSIS — Z3490 Encounter for supervision of normal pregnancy, unspecified, unspecified trimester: Secondary | ICD-10-CM

## 2017-07-10 DIAGNOSIS — Z3491 Encounter for supervision of normal pregnancy, unspecified, first trimester: Secondary | ICD-10-CM

## 2017-07-11 ENCOUNTER — Encounter: Payer: Self-pay | Admitting: Internal Medicine

## 2017-07-11 ENCOUNTER — Ambulatory Visit (INDEPENDENT_AMBULATORY_CARE_PROVIDER_SITE_OTHER): Payer: Medicaid Other | Admitting: Internal Medicine

## 2017-07-11 DIAGNOSIS — O021 Missed abortion: Secondary | ICD-10-CM

## 2017-07-11 LAB — BETA HCG QUANT (REF LAB): hCG Quant: 32724 m[IU]/mL

## 2017-07-11 MED ORDER — MISOPROSTOL 200 MCG PO TABS: 800.0000 ug | ORAL_TABLET | Freq: Once | ORAL | 1 refills | Status: DC

## 2017-07-11 NOTE — Patient Instructions (Addendum)
I am so sorry for your loss. Like I mentioned, this is NOT your fault at all.   After our discussion, a decision was made to proceed with medical therapy:  1) I prescribed you Cytotec (4 tablets total) to place intravaginally once. In about 1 hour after placement, you will have cramping and bleeding.  - the first two days of bleeding is the worst/heaviest. You will likely soak 1 pad per hour up to 4 hours. If this prolongs, then you need to go to the MAU (ED at [facility] hospital) to make sure you are not becoming anemic (losing too much blood)  - you will bleed for 7- 10 days.  - you will have an appointment with OBGYN at [facility] hospital for follow up in 2 weeks.  - I sent in a refill of the Cytotec medication. If you are not having bleeding 24 hours after the first dose, you can take the second dose (vaginally). Please let us know if this happens.   Please call with any questions.   [facility]:  Address: [address] [place], [place] [address] Phone: [phone]

## 2017-07-11 NOTE — Progress Notes (Addendum)
   [facility] Phone: [phone]   Date of Visit: 07/11/2017   HPI:  Patient came in today to discuss ultrasound results which showed failed IUP. Discussed options for management including expectant management, medication, vs D&C. She is not having any vaginal bleeding. She had a little bit of cramping last night.   Discussed with Dr. Jolayne Panther regarding management. Recommended Cytotec Vaginally . Refer to instructions below.  1) I prescribed you Cytotec (4 tablets total) to place intravaginally once. In about 1 hour after placement, you will have cramping and bleeding.  - the first two days of bleeding is the worst/heaviest. You will likely soak 1 pad per hour up to 4 hours. If this prolongs, then you need to go to the MAU (ED at [facility] hospital) to make sure you are not becoming anemic (losing too much blood)  - you will bleed for 7- 10 days.  - you will have an appointment with OBGYN at [facility] hospital for follow up in 2 weeks.  - I sent in a refill of the Cytotec medication. If you are not having bleeding 24 hours after the first dose, you can take the second dose (vaginally). Please let us know if this happens.   PMH: No significant past medical history Exam: Gen: NAD  ASSESSMENT/PLAN:  1. Missed abortion -  Cytotec (4 tablets total) to place intravaginally once - Ambulatory referral to Obstetrics / Gynecology  Palma Holter, MD PGY 3 [facility]

## 2017-07-24 ENCOUNTER — Encounter: Payer: Self-pay | Admitting: Obstetrics and Gynecology

## 2017-07-24 ENCOUNTER — Ambulatory Visit (INDEPENDENT_AMBULATORY_CARE_PROVIDER_SITE_OTHER): Payer: Medicaid Other | Admitting: Obstetrics and Gynecology

## 2017-07-24 DIAGNOSIS — O021 Missed abortion: Secondary | ICD-10-CM | POA: Diagnosis present

## 2017-07-24 NOTE — Progress Notes (Signed)
Jill Rush presents for follow up after Cytotec for failed pregnancy. She reports passed POC's after taking Cytotec.  Having a little spotting o/w no complaints. Denies bowel or bladder dysfunction  PE AF VSS Lungs clear Heart RRR Abd soft + BS GU deferred  A/P AB  Will check BHCG today. Pt undecided about contraception. Information on choices provided to pt. Advised to use condoms at minium for now. To follow up with [facility] as to choice. F/U here in clinic per BHCG results

## 2017-07-24 NOTE — Patient Instructions (Signed)

## 2017-07-25 ENCOUNTER — Encounter (HOSPITAL_COMMUNITY): Payer: Self-pay

## 2017-07-25 ENCOUNTER — Inpatient Hospital Stay (HOSPITAL_COMMUNITY)
Admission: AD | Admit: 2017-07-25 | Discharge: 2017-07-25 | Disposition: A | Discharge status: Home / Self Care | Payer: Medicaid Other | Source: Ambulatory Visit | Attending: Obstetrics & Gynecology | Admitting: Obstetrics & Gynecology

## 2017-07-25 DIAGNOSIS — O034 Incomplete spontaneous abortion without complication: Secondary | ICD-10-CM | POA: Diagnosis not present

## 2017-07-25 DIAGNOSIS — N939 Abnormal uterine and vaginal bleeding, unspecified: Secondary | ICD-10-CM

## 2017-07-25 DIAGNOSIS — E349 Endocrine disorder, unspecified: Secondary | ICD-10-CM

## 2017-07-25 DIAGNOSIS — R109 Unspecified abdominal pain: Secondary | ICD-10-CM

## 2017-07-25 DIAGNOSIS — O021 Missed abortion: Secondary | ICD-10-CM

## 2017-07-25 LAB — BETA HCG QUANT (REF LAB): HCG QUANT: 6664 m[IU]/mL

## 2017-07-25 LAB — URINALYSIS, ROUTINE W REFLEX MICROSCOPIC

## 2017-07-25 LAB — URINALYSIS, MICROSCOPIC (REFLEX)
RBC / HPF: >50 RBC/hpf (ref 0–5)
WBC, UA: NONE SEEN WBC/hpf (ref 0–5)

## 2017-07-25 MED ORDER — ONDANSETRON 8 MG PO TBDP: 8.0000 mg | ORAL_TABLET | Freq: Three times a day (TID) | ORAL | 0 refills | Status: DC | PRN

## 2017-07-25 MED ORDER — ONDANSETRON 8 MG PO TBDP
8.0000 mg | ORAL_TABLET | Freq: Once | ORAL | Status: AC
  Administered 2017-07-25: 8 mg via ORAL
  Filled 2017-07-25: qty 1

## 2017-07-25 MED ORDER — HYDROMORPHONE HCL 1 MG/ML IJ SOLN
1.0000 mg | Freq: Once | INTRAMUSCULAR | Status: AC
  Administered 2017-07-25: 1 mg via INTRAMUSCULAR
  Filled 2017-07-25: qty 1

## 2017-07-25 MED ORDER — OXYCODONE-ACETAMINOPHEN 5-325 MG PO TABS: 2.0000 | ORAL_TABLET | Freq: Four times a day (QID) | ORAL | 0 refills | Status: AC | PRN

## 2017-07-25 MED ORDER — MISOPROSTOL 200 MCG PO TABS
800.0000 ug | ORAL_TABLET | Freq: Once | ORAL | Status: AC
  Administered 2017-07-25: 800 ug via ORAL
  Filled 2017-07-25: qty 4

## 2017-07-25 NOTE — MAU Provider Note (Signed)
Chief Complaint: Abdominal Pain and Vaginal Bleeding   First Provider Initiated Contact with Patient 07/25/17 [phone]      SUBJECTIVE HPI: Jill Rush is a 27 y.o. G1P0000 at [redacted]w[redacted]d by LMP who presents to maternity admissions reporting abdominal pain and vaginal bleeding. She reports having a missed abortion on 5/16. Was prescribed Cytotec vaginally that day- bled for one week and passed what she thought to be tissue. She was seen yesterday for f/u and noted her HCG level to be 6,664. She reports vaginal spotting yesterday. She reports abdominal cramping and heavy vaginal bleeding started back last night around 2200. She reports having to change her pad every hour for 2-3 hours. She describes the pain as lower abdominal cramping, rates pain 10/10 has taken OTC pain medication with no relief. She reports nausea is associated with her symptoms. She denies feeling weak or dizzy.  Past Medical History:  Diagnosis Date  . No pertinent past medical history    History reviewed. No pertinent surgical history. Social History   Socioeconomic History  . Marital status: Single    Spouse name: Not on file  . Number of children: Not on file  . Years of education: Not on file  . Highest education level: Not on file  Occupational History  . Not on file  Social Needs  . Financial resource strain: Not on file  . Food insecurity:    Worry: Not on file    Inability: Not on file  . Transportation needs:    Medical: Not on file    Non-medical: Not on file  Tobacco Use  . Smoking status: Never Smoker  . Smokeless tobacco: Never Used  Substance and Sexual Activity  . Alcohol use: Yes    Alcohol/week: 0.5 oz    Types: 1 Standard drinks or equivalent per week  . Drug use: No  . Sexual activity: Yes    Partners: Male  Lifestyle  . Physical activity:    Days per week: Not on file    Minutes per session: Not on file  . Stress: Not on file  Relationships  . Social connections:    Talks on  phone: Not on file    Gets together: Not on file    Attends religious service: Not on file    Active member of club or organization: Not on file    Attends meetings of clubs or organizations: Not on file    Relationship status: Not on file  . Intimate partner violence:    Fear of current or ex partner: Not on file    Emotionally abused: Not on file    Physically abused: Not on file    Forced sexual activity: Not on file  Other Topics Concern  . Not on file  Social History Narrative   [employer] - Engineer, site   Living with her sister   No current facility-administered medications on file prior to encounter.    Current Outpatient Medications on File Prior to Encounter  Medication Sig Dispense Refill  . misoprostol (CYTOTEC) 200 MCG tablet Place 4 tablets (800 mcg total) vaginally once for 1 dose. 4 tablet 1   No Known Allergies  ROS:  Review of Systems  Respiratory: Negative.   Cardiovascular: Negative.   Gastrointestinal: Positive for abdominal pain and nausea. Negative for constipation, diarrhea and vomiting.  Genitourinary: Positive for vaginal bleeding. Negative for difficulty urinating, dysuria, frequency, pelvic pain and urgency.  Musculoskeletal: Negative.    I have reviewed patient's Past  Medical Hx, Surgical Hx, Family Hx, Social Hx, medications and allergies.   Physical Exam   Vitals:   07/25/17 0619 07/25/17 0753  BP: 125/76 108/70  Pulse: (!) 108 (!) 102  Resp: 20 18  Temp: 98.4 F (36.9 C)   TempSrc: Oral   SpO2: 100%    Constitutional: Well-developed, well-nourished female in no acute distress.  Cardiovascular: normal rate Respiratory: normal effort GI: Abd soft, non-tender. Pos BS x 4 Neurologic: Alert and oriented x 4.   PELVIC EXAM: Cervix pink, visually open to 2-3cm, without lesion, products of conception protruding through open cervix- removed with kelly and sent to pathology, vaginal walls and external genitalia normal  LAB RESULTS Results  for orders placed or performed during the hospital encounter of 07/25/17 (from the past 24 hour(s))  Urinalysis, Routine w reflex microscopic     Status: Abnormal   Collection Time: 07/25/17  6:05 AM  Result Value Ref Range   Color, Urine RED (A) YELLOW   APPearance TURBID (A) CLEAR   Specific Gravity, Urine  1.005 - 1.030    TEST NOT REPORTED DUE TO COLOR INTERFERENCE OF URINE PIGMENT   pH  5.0 - 8.0    TEST NOT REPORTED DUE TO COLOR INTERFERENCE OF URINE PIGMENT   Glucose, UA (A) NEGATIVE mg/dL    TEST NOT REPORTED DUE TO COLOR INTERFERENCE OF URINE PIGMENT   Hgb urine dipstick (A) NEGATIVE    TEST NOT REPORTED DUE TO COLOR INTERFERENCE OF URINE PIGMENT   Bilirubin Urine (A) NEGATIVE    TEST NOT REPORTED DUE TO COLOR INTERFERENCE OF URINE PIGMENT   Ketones, ur (A) NEGATIVE mg/dL    TEST NOT REPORTED DUE TO COLOR INTERFERENCE OF URINE PIGMENT   Protein, ur (A) NEGATIVE mg/dL    TEST NOT REPORTED DUE TO COLOR INTERFERENCE OF URINE PIGMENT   Nitrite (A) NEGATIVE    TEST NOT REPORTED DUE TO COLOR INTERFERENCE OF URINE PIGMENT   Leukocytes, UA (A) NEGATIVE    TEST NOT REPORTED DUE TO COLOR INTERFERENCE OF URINE PIGMENT  Urinalysis, Microscopic (reflex)     Status: Abnormal   Collection Time: 07/25/17  6:05 AM  Result Value Ref Range   RBC / HPF >50 0 - 5 RBC/hpf   WBC, UA NONE SEEN 0 - 5 WBC/hpf   Bacteria, UA RARE (A) NONE SEEN   Squamous Epithelial / LPF 0-5 0 - 5    O/Positive/-- (04/16 1610)  MAU Management/MDM: Orders Placed This Encounter  Procedures  . Urinalysis, Routine w reflex microscopic  . Urinalysis, Microscopic (reflex)  . Discharge patient Discharge disposition: 01-Home or Self Care; Discharge patient date: 07/25/2017   POC sent to Pathology- results pending   Meds ordered this encounter  Medications  . HYDROmorphone (DILAUDID) injection 1 mg  . ondansetron (ZOFRAN-ODT) disintegrating tablet 8 mg  . misoprostol (CYTOTEC) tablet 800 mcg  . ondansetron  (ZOFRAN-ODT) 8 MG disintegrating tablet    Sig: Take 1 tablet (8 mg total) by mouth every 8 (eight) hours as needed for nausea or vomiting.    Dispense:  10 tablet    Refill:  0    Order Specific Question:   Supervising Provider    Answer:   Despina Hidden, LUTHER H [[id number]]  . oxyCODONE-acetaminophen (PERCOCET/ROXICET) 5-325 MG tablet    Sig: Take 2 tablets by mouth every 6 (six) hours as needed for up to 3 days for severe pain.    Dispense:  12 tablet    Refill:  0  Order Specific Question:   Supervising Provider    Answer:   Lazaro Arms [[id number]]   Treatments in MAU included  Dilaudid for pain management, Zofran  for nausea and Cytotec for bleeding management- given in MAU prior to discharge. Patient reports significant decrease in pain with removal of POC at cervical os. Pt reports no pain after medication treatment with dilaudid.   Pt discharged. F/u in one week for repeat HCG in the office. Discussed reasons to return to MAU. Rx for Zofran and Percocet given for home use for pain management through Cytotec dosing.   ASSESSMENT 1. Retained products of conception after miscarriage   2. Missed abortion   3. Abdominal cramping   4. Abnormal vaginal bleeding   5. Elevated serum hCG     PLAN Discharge home Reasons to return to MAU discussed  F/u in the office in one week for repeat HCG  Return to MAU for worsening symptoms, lightheadedness or dizziness  Rx for Zofran and Percocet sent to pharmacy of choice  Follow-up Information    [facility]. Schedule an appointment as soon as possible for a visit in 1 week(s).   Specialty:  Obstetrics and Gynecology Why:  Follow up in 1 week for repeat lab work  Contact information: [address] [place] [address] [phone]          Allergies as of 07/25/2017   No Known Allergies     Medication List    STOP taking these medications   misoprostol 200 MCG tablet Commonly known as:   CYTOTEC     TAKE these medications   ondansetron 8 MG disintegrating tablet Commonly known as:  ZOFRAN-ODT Take 1 tablet (8 mg total) by mouth every 8 (eight) hours as needed for nausea or vomiting.   oxyCODONE-acetaminophen 5-325 MG tablet Commonly known as:  PERCOCET/ROXICET Take 2 tablets by mouth every 6 (six) hours as needed for up to 3 days for severe pain.      Steward Drone  Certified Nurse-Midwife 07/26/2017  7:13 AM

## 2017-07-25 NOTE — MAU Note (Signed)
Pt states she was told she had failed pregnancy and had a miscarriage Friday. States she passed tissue then. Tonight she reports sudden lower abdominal pain/cramping and spotting. States she now has heavy vaginal bleeding where she has had to change a pad every hour for 2-3 hours. Pt states something came out in the toilet earlier today. Pt states she took zantac and some OTC pain medicine but it did not work.

## 2017-07-29 ENCOUNTER — Encounter (HOSPITAL_COMMUNITY): Payer: Self-pay

## 2017-07-30 ENCOUNTER — Other Ambulatory Visit: Payer: Self-pay | Admitting: General Practice

## 2017-07-30 DIAGNOSIS — O021 Missed abortion: Secondary | ICD-10-CM

## 2017-07-31 ENCOUNTER — Other Ambulatory Visit: Payer: Medicaid Other

## 2017-07-31 DIAGNOSIS — O021 Missed abortion: Secondary | ICD-10-CM

## 2017-08-01 LAB — BETA HCG QUANT (REF LAB): HCG QUANT: 210 m[IU]/mL

## 2017-08-05 ENCOUNTER — Ambulatory Visit (HOSPITAL_COMMUNITY): Payer: Self-pay

## 2017-08-05 ENCOUNTER — Other Ambulatory Visit: Payer: Self-pay

## 2017-08-05 ENCOUNTER — Encounter: Payer: Self-pay | Admitting: *Deleted

## 2017-08-05 ENCOUNTER — Telehealth: Payer: Self-pay | Admitting: *Deleted

## 2017-08-05 ENCOUNTER — Ambulatory Visit (HOSPITAL_COMMUNITY): Admission: RE | Admit: 2017-08-05 | Payer: Self-pay | Source: Ambulatory Visit

## 2017-08-05 DIAGNOSIS — O021 Missed abortion: Secondary | ICD-10-CM

## 2017-08-05 NOTE — Telephone Encounter (Signed)
Called pt and left message stating that I am calling in regards to test results. I stated that I will send a detailed message to her MyChart account and to please check it today.

## 2017-08-06 ENCOUNTER — Other Ambulatory Visit: Payer: Medicaid Other

## 2017-08-06 DIAGNOSIS — O021 Missed abortion: Secondary | ICD-10-CM

## 2017-08-07 LAB — BETA HCG QUANT (REF LAB): hCG Quant: 31 m[IU]/mL

## 2017-08-08 ENCOUNTER — Encounter: Payer: Self-pay | Admitting: General Practice

## 2017-08-16 ENCOUNTER — Other Ambulatory Visit: Payer: Self-pay | Admitting: *Deleted

## 2017-08-16 DIAGNOSIS — O021 Missed abortion: Secondary | ICD-10-CM

## 2017-08-16 NOTE — Progress Notes (Signed)
bhc 

## 2017-08-19 ENCOUNTER — Other Ambulatory Visit: Payer: Medicaid Other

## 2017-08-19 ENCOUNTER — Ambulatory Visit: Payer: Medicaid Other

## 2017-08-20 ENCOUNTER — Encounter: Payer: Self-pay | Admitting: Obstetrics and Gynecology

## 2017-08-21 ENCOUNTER — Other Ambulatory Visit: Payer: Medicaid Other

## 2017-08-21 DIAGNOSIS — O021 Missed abortion: Secondary | ICD-10-CM

## 2017-08-22 LAB — BETA HCG QUANT (REF LAB): HCG QUANT: 3 m[IU]/mL

## 2017-08-23 ENCOUNTER — Encounter: Payer: Self-pay | Admitting: General Practice

## 2017-09-02 ENCOUNTER — Encounter: Payer: Self-pay | Admitting: Advanced Practice Midwife

## 2017-09-02 ENCOUNTER — Ambulatory Visit (INDEPENDENT_AMBULATORY_CARE_PROVIDER_SITE_OTHER): Payer: Medicaid Other | Admitting: Advanced Practice Midwife

## 2017-09-02 VITALS — BP 113/73 | HR 94 | Wt 178.2 lb

## 2017-09-02 DIAGNOSIS — Z3169 Encounter for other general counseling and advice on procreation: Secondary | ICD-10-CM | POA: Diagnosis not present

## 2017-09-02 DIAGNOSIS — O039 Complete or unspecified spontaneous abortion without complication: Secondary | ICD-10-CM | POA: Diagnosis not present

## 2017-09-02 NOTE — Progress Notes (Signed)
  Subjective:     Patient ID: Jill Rush, female   DOB: December 30, 1990, 27 y.o.   MRN: [id number]  Jill Rush is a 27 y.o. G1P0000 who is here today for SAB follow up. HCG was followed down to 3. She states that she had a period on 08/19/17 that was heavier than normal, but it has stopped. She has not had any bleeding since. She is not planning a pregnancy, but is not interested in preventing a pregnancy at this time. She denies any history of chronic health conditions. She has been on birth control in the past including depo, OCPs and Nuva ring.     Review of Systems  All other systems reviewed and are negative.     Objective:   Physical Exam  Constitutional: She appears well-developed and well-nourished.  HENT:  Head: Normocephalic.  Neck: No thyromegaly present.  Pulmonary/Chest: Effort normal.  Abdominal: Soft. There is no tenderness.  Skin: Skin is warm and dry.  Nursing note and vitals reviewed.     Results for Jill InchGRAHAM, Jill Rush (MRN [id number]) as of 09/02/2017 17:17  Ref. Range 07/24/2017 15:41   07/31/2017 08:40 08/06/2017 18:09 08/21/2017 16:27  hCG Quant Latest Units: mIU/mL 6,664   210 31 3   Assessment:     1. SAB (spontaneous abortion)   2. Encounter for preconception consultation        Plan:     FU as needed Pap up to date Continue prenatal vitamins No chronic health conditions, ok to achieve pregnancy when ready

## 2017-09-19 ENCOUNTER — Encounter (HOSPITAL_COMMUNITY): Payer: Self-pay | Admitting: Student

## 2017-09-19 ENCOUNTER — Inpatient Hospital Stay (HOSPITAL_COMMUNITY)
Admission: AD | Admit: 2017-09-19 | Discharge: 2017-09-19 | Disposition: A | Discharge status: Home / Self Care | Payer: Medicaid Other | Source: Ambulatory Visit | Attending: Obstetrics and Gynecology | Admitting: Obstetrics and Gynecology

## 2017-09-19 DIAGNOSIS — N898 Other specified noninflammatory disorders of vagina: Secondary | ICD-10-CM | POA: Diagnosis not present

## 2017-09-19 DIAGNOSIS — R1032 Left lower quadrant pain: Secondary | ICD-10-CM | POA: Diagnosis present

## 2017-09-19 DIAGNOSIS — Z8249 Family history of ischemic heart disease and other diseases of the circulatory system: Secondary | ICD-10-CM | POA: Insufficient documentation

## 2017-09-19 DIAGNOSIS — M545 Low back pain: Secondary | ICD-10-CM | POA: Insufficient documentation

## 2017-09-19 HISTORY — DX: Other specified health status: Z78.9

## 2017-09-19 LAB — URINALYSIS, ROUTINE W REFLEX MICROSCOPIC
BILIRUBIN URINE: NEGATIVE
GLUCOSE, UA: NEGATIVE mg/dL
HGB URINE DIPSTICK: NEGATIVE
Ketones, ur: NEGATIVE mg/dL
Leukocytes, UA: NEGATIVE
Nitrite: NEGATIVE
Protein, ur: NEGATIVE mg/dL
SPECIFIC GRAVITY, URINE: 1.03 (ref 1.005–1.030)
pH: 5 (ref 5.0–8.0)

## 2017-09-19 LAB — POCT PREGNANCY, URINE: Preg Test, Ur: NEGATIVE

## 2017-09-19 MED ORDER — IBUPROFEN 600 MG PO TABS
600.0000 mg | ORAL_TABLET | Freq: Once | ORAL | Status: AC
  Administered 2017-09-19: 600 mg via ORAL
  Filled 2017-09-19: qty 1

## 2017-09-19 NOTE — MAU Provider Note (Signed)
Chief Complaint:  Abdominal Pain and Back Pain   First Provider Initiated Contact with Patient 09/19/17 2101       HPI: Jill Rush is a 27 y.o. G1P0010 who presents to maternity admissions reporting Left lower quadrant pain with some low back pain for the past week or two.  . She reports vaginal bleeding, vaginal itching/burning, urinary symptoms, h/a, dizziness, n/v, or fever/chills.  Recently had miscarriage in May.    Abdominal Pain  This is a new problem. The current episode started 1 to 4 weeks ago. The onset quality is gradual. The problem occurs intermittently. The problem has been unchanged. The pain is located in the LLQ. The quality of the pain is cramping. The abdominal pain radiates to the back. Pertinent negatives include no constipation, diarrhea, dysuria, fever or frequency. Nothing aggravates the pain. The pain is relieved by nothing. She has tried acetaminophen for the symptoms. The treatment provided moderate relief.  Back Pain  The current episode started 1 to 4 weeks ago. The problem occurs intermittently. The pain is present in the lumbar spine. The quality of the pain is described as aching. Associated symptoms include abdominal pain. Pertinent negatives include no dysuria or fever. She has tried analgesics for the symptoms. The treatment provided moderate relief.    RN Note: Pt reports intermittent LLQ pain and back pain and cramping that started about a week ago. Pt denies vaginal bleeding, but reports some vaginal discharge. Denies odor or itching. Rates 8/10. Pt states she has tried Tylenol and resting. Last took Tylenol 2 days ago. Pt states it helps some. LMP: 08/19/2017. Pt reports she had a -upt at home.     Past Medical History: Past Medical History:  Diagnosis Date  . Medical history non-contributory   . No pertinent past medical history     Past obstetric history: OB History  Gravida Para Term Preterm AB Living  1       1    SAB TAB Ectopic  Multiple Live Births  1 0          # Outcome Date GA Lbr Len/2nd Weight Sex Delivery Anes PTL Lv  1 SAB 06/2017            Past Surgical History: Past Surgical History:  Procedure Laterality Date  . NO PAST SURGERIES      Family History: Family History  Problem Relation Age of Onset  . Hypertension Mother   . Diabetes Sister        with pregnancy    Social History: Social History   Tobacco Use  . Smoking status: Never Smoker  . Smokeless tobacco: Never Used  Substance Use Topics  . Alcohol use: Yes    Alcohol/week: 0.6 oz    Types: 1 Standard drinks or equivalent per week  . Drug use: No    Allergies: No Known Allergies  Meds:  Medications Prior to Admission  Medication Sig Dispense Refill Last Dose  . ondansetron (ZOFRAN-ODT) 8 MG disintegrating tablet Take 1 tablet (8 mg total) by mouth every 8 (eight) hours as needed for nausea or vomiting. (Patient not taking: Reported on 09/02/2017) 10 tablet 0 Not Taking    I have reviewed patient's Past Medical Hx, Surgical Hx, Family Hx, Social Hx, medications and allergies.  ROS:  Review of Systems  Constitutional: Negative for fever.  Gastrointestinal: Positive for abdominal pain. Negative for constipation and diarrhea.  Genitourinary: Negative for dysuria and frequency.  Musculoskeletal: Positive for back pain.  Other systems negative     Physical Exam   Patient Vitals for the past 24 hrs:  BP Temp Temp src Pulse Resp SpO2 Height Weight  09/19/17 2006 121/77 98.9 F (37.2 C) Oral 75 18 100 % 5' (1.524 m) 179 lb (81.2 kg)   Constitutional: Well-developed, well-nourished female in no acute distress.  Cardiovascular: normal rate and rhythm, no ectopy audible, S1 & S2 heard, no murmur Respiratory: normal effort, no distress. Lungs CTAB with no wheezes or crackles GI: Abd soft, non-tender.  Nondistended.  No rebound, No guarding.  Bowel Sounds audible  MS: Extremities nontender, no edema, normal ROM Neurologic:  Alert and oriented x 4.   Grossly nonfocal. GU: Neg CVAT. Skin:  Warm and Dry Psych:  Affect appropriate.  PELVIC EXAM: Cervix pink, visually closed, without lesion, scant white creamy discharge, vaginal walls and external genitalia normal Bimanual exam: Cervix firm, anterior, neg CMT, uterus nontender, nonenlarged, adnexa without tenderness, enlargement, or mass    Labs: Results for orders placed or performed during the hospital encounter of 09/19/17 (from the past 24 hour(s))  Urinalysis, Routine w reflex microscopic     Status: None   Collection Time: 09/19/17  8:01 PM  Result Value Ref Range   Color, Urine YELLOW YELLOW   APPearance CLEAR CLEAR   Specific Gravity, Urine 1.030 1.005 - 1.030   pH 5.0 5.0 - 8.0   Glucose, UA NEGATIVE NEGATIVE mg/dL   Hgb urine dipstick NEGATIVE NEGATIVE   Bilirubin Urine NEGATIVE NEGATIVE   Ketones, ur NEGATIVE NEGATIVE mg/dL   Protein, ur NEGATIVE NEGATIVE mg/dL   Nitrite NEGATIVE NEGATIVE   Leukocytes, UA NEGATIVE NEGATIVE  Pregnancy, urine POC     Status: None   Collection Time: 09/19/17  8:27 PM  Result Value Ref Range   Preg Test, Ur NEGATIVE NEGATIVE   O/Positive/-- (04/16 0931)  Imaging:  No results found.  MAU Course/MDM: I have ordered labs as follows: UA, UPT, both negative Imaging ordered: Outpatient US to follow Results reviewed. Discussed LLQ pain is likely either GI or possibly ovarian cyst.  No sign of significant pelvic tenderness or torsion.  Will check outpatient US to view ovaries   Treatments in MAU included ibuprofen with some relief.   Pt stable at time of discharge.  Assessment: LLQ pain - Plan: Discharge patient, US PELVIS (TRANSABDOMINAL ONLY), US PELVIS TRANSVANGINAL NON-OB (TV ONLY)   Plan: Discharge home Recommend push fluids, ibuprofen for pain US ordered outpatient to rule out GYN source of pain Followup with family doctor for other evaluations if pain continues  Encouraged to return here or to other  Urgent Care/ED if she develops worsening of symptoms, increase in pain, fever, or other concerning symptoms.   Wynelle BourgeoisMarie Othell Diluzio CNM, MSN Certified Nurse-Midwife 09/19/2017 9:01 PM

## 2017-09-19 NOTE — Discharge Instructions (Signed)
Ovarian Cyst An ovarian cyst is a fluid-filled sac that forms on an ovary. The ovaries are small organs that produce eggs in women. Various types of cysts can form on the ovaries. Some may cause symptoms and require treatment. Most ovarian cysts go away on their own, are not cancerous (are benign), and do not cause problems. Common types of ovarian cysts include:  Functional (follicle) cysts. ? Occur during the menstrual cycle, and usually go away with the next menstrual cycle if you do not get pregnant. ? Usually cause no symptoms.  Endometriomas. ? Are cysts that form from the tissue that lines the uterus (endometrium). ? Are sometimes called "chocolate cysts" because they become filled with blood that turns brown. ? Can cause pain in the lower abdomen during intercourse and during your period.  Cystadenoma cysts. ? Develop from cells on the outside surface of the ovary. ? Can get very large and cause lower abdomen pain and pain with intercourse. ? Can cause severe pain if they twist or break open (rupture).  Dermoid cysts. ? Are sometimes found in both ovaries. ? May contain different kinds of body tissue, such as skin, teeth, hair, or cartilage. ? Usually do not cause symptoms unless they get very big.  Theca lutein cysts. ? Occur when too much of a certain hormone (human chorionic gonadotropin) is produced and overstimulates the ovaries to produce an egg. ? Are most common after having procedures used to assist with the conception of a baby (in vitro fertilization).  What are the causes? Ovarian cysts may be caused by:  Ovarian hyperstimulation syndrome. This is a condition that can develop from taking fertility medicines. It causes multiple large ovarian cysts to form.  Polycystic ovarian syndrome (PCOS). This is a common hormonal disorder that can cause ovarian cysts, as well as problems with your period or fertility.  What increases the risk? The following factors may make  you more likely to develop ovarian cysts:  Being overweight or obese.  Taking fertility medicines.  Taking certain forms of hormonal birth control.  Smoking.  What are the signs or symptoms? Many ovarian cysts do not cause symptoms. If symptoms are present, they may include:  Pelvic pain or pressure.  Pain in the lower abdomen.  Pain during sex.  Abdominal swelling.  Abnormal menstrual periods.  Increasing pain with menstrual periods.  How is this diagnosed? These cysts are commonly found during a routine pelvic exam. You may have tests to find out more about the cyst, such as:  Ultrasound.  X-ray of the pelvis.  CT scan.  MRI.  Blood tests.  How is this treated? Many ovarian cysts go away on their own without treatment. Your health care provider may want to check your cyst regularly for 2-3 months to see if it changes. If you are in menopause, it is especially important to have your cyst monitored closely because menopausal women have a higher rate of ovarian cancer. When treatment is needed, it may include:  Medicines to help relieve pain.  A procedure to drain the cyst (aspiration).  Surgery to remove the whole cyst.  Hormone treatment or birth control pills. These methods are sometimes used to help dissolve a cyst.  Follow these instructions at home:  Take over-the-counter and prescription medicines only as told by your health care provider.  Do not drive or use heavy machinery while taking prescription pain medicine.  Get regular pelvic exams and Pap tests as often as told by your health care   provider.  Return to your normal activities as told by your health care provider. Ask your health care provider what activities are safe for you.  Do not use any products that contain nicotine or tobacco, such as cigarettes and e-cigarettes. If you need help quitting, ask your health care provider.  Keep all follow-up visits as told by your health care provider.  This is important. Contact a health care provider if:  Your periods are late, irregular, or painful, or they stop.  You have pelvic pain that does not go away.  You have pressure on your bladder or trouble emptying your bladder completely.  You have pain during sex.  You have any of the following in your abdomen: ? A feeling of fullness. ? Pressure. ? Discomfort. ? Pain that does not go away. ? Swelling.  You feel generally ill.  You become constipated.  You lose your appetite.  You develop severe acne.  You start to have more body hair and facial hair.  You are gaining weight or losing weight without changing your exercise and eating habits.  You think you may be pregnant. Get help right away if:  You have abdominal pain that is severe or gets worse.  You cannot eat or drink without vomiting.  You suddenly develop a fever.  Your menstrual period is much heavier than usual. This information is not intended to replace advice given to you by your health care provider. Make sure you discuss any questions you have with your health care provider. Document Released: 02/12/2005 Document Revised: 09/02/2015 Document Reviewed: 07/17/2015 Elsevier Interactive Patient Education  2018 Elsevier Inc. Pelvic Pain, Female Pelvic pain is pain in your lower abdomen, below your belly button and between your hips. The pain may start suddenly (acute), keep coming back (recurring), or last a long time (chronic). Pelvic pain that lasts longer than six months is considered chronic. Pelvic pain may affect your:  Reproductive organs.  Urinary system.  Digestive tract.  Musculoskeletal system.  There are many potential causes of pelvic pain. Sometimes, the pain can be a result of digestive or urinary conditions, strained muscles or ligaments, or even reproductive conditions. Sometimes the cause of pelvic pain is not known. Follow these instructions at home:  Take over-the-counter and  prescription medicines only as told by your health care provider.  Rest as told by your health care provider.  Do not have sex it if hurts.  Keep a journal of your pelvic pain. Write down: ? When the pain started. ? Where the pain is located. ? What seems to make the pain better or worse, such as food or your menstrual cycle. ? Any symptoms you have along with the pain.  Keep all follow-up visits as told by your health care provider. This is important. Contact a health care provider if:  Medicine does not help your pain.  Your pain comes back.  You have new symptoms.  You have abnormal vaginal discharge or bleeding, including bleeding after menopause.  You have a fever or chills.  You are constipated.  You have blood in your urine or stool.  You have foul-smelling urine.  You feel weak or lightheaded. Get help right away if:  You have sudden severe pain.  Your pain gets steadily worse.  You have severe pain along with fever, nausea, vomiting, or excessive sweating.  You lose consciousness. This information is not intended to replace advice given to you by your health care provider. Make sure you discuss any questions   you have with your health care provider. Document Released: 01/10/2004 Document Revised: 03/09/2015 Document Reviewed: 12/03/2014 Elsevier Interactive Patient Education  2018 Elsevier Inc.  

## 2017-09-19 NOTE — MAU Note (Addendum)
Pt reports intermittent LLQ pain and back pain and cramping that started about a week ago. Pt denies vaginal bleeding, but reports some vaginal discharge. Denies odor or itching. Rates 8/10. Pt states she has tried Tylenol and resting. Last took Tylenol 2 days ago. Pt states it helps some. LMP: 08/19/2017. Pt reports she had a -upt at home.

## 2017-09-27 ENCOUNTER — Ambulatory Visit (HOSPITAL_COMMUNITY): Payer: Medicaid Other

## 2017-10-14 ENCOUNTER — Ambulatory Visit: Payer: Medicaid Other | Admitting: Advanced Practice Midwife

## 2018-01-01 ENCOUNTER — Ambulatory Visit: Payer: Medicaid Other | Admitting: Nurse Practitioner

## 2018-01-01 ENCOUNTER — Encounter: Payer: Self-pay | Admitting: Nurse Practitioner

## 2018-01-01 VITALS — BP 123/82 | HR 66 | Wt 185.1 lb

## 2018-01-01 DIAGNOSIS — N898 Other specified noninflammatory disorders of vagina: Secondary | ICD-10-CM

## 2018-01-01 DIAGNOSIS — Z202 Contact with and (suspected) exposure to infections with a predominantly sexual mode of transmission: Secondary | ICD-10-CM

## 2018-01-01 NOTE — Progress Notes (Signed)
Client left without being seen by provider.  Nolene Bernheim, RN, MSN, NP-BC Nurse Practitioner, [place] [facility] [employer], [facility] 01/01/2018 6:05 PM

## 2018-05-14 ENCOUNTER — Telehealth: Payer: Self-pay | Admitting: Family Medicine

## 2018-05-14 NOTE — Telephone Encounter (Signed)
Spoke with her  No acute symptoms.  She will wait and call back in a few weeks for an appointment

## 2018-05-14 NOTE — Telephone Encounter (Signed)
Pt is calling and would like to have a routine check up possible pap with std testing. I tried calling the phone number for [facility], was not able to get a hold of anyone.   Please call pt to discuss what steps should be taken to be seen.

## 2018-05-29 ENCOUNTER — Ambulatory Visit (HOSPITAL_COMMUNITY)
Admission: EM | Admit: 2018-05-29 | Discharge: 2018-05-29 | Disposition: A | Discharge status: Home / Self Care | Payer: Medicaid Other | Attending: Family Medicine | Admitting: Family Medicine

## 2018-05-29 ENCOUNTER — Encounter (HOSPITAL_COMMUNITY): Payer: Self-pay

## 2018-05-29 ENCOUNTER — Other Ambulatory Visit: Payer: Self-pay

## 2018-05-29 DIAGNOSIS — Z113 Encounter for screening for infections with a predominantly sexual mode of transmission: Secondary | ICD-10-CM | POA: Diagnosis not present

## 2018-05-29 DIAGNOSIS — Z711 Person with feared health complaint in whom no diagnosis is made: Secondary | ICD-10-CM | POA: Diagnosis not present

## 2018-05-29 MED ORDER — AZITHROMYCIN 250 MG PO TABS
1000.0000 mg | ORAL_TABLET | Freq: Once | ORAL | Status: AC
  Administered 2018-05-29: 1000 mg via ORAL

## 2018-05-29 MED ORDER — AZITHROMYCIN 250 MG PO TABS
ORAL_TABLET | ORAL | Status: AC
  Filled 2018-05-29: qty 4

## 2018-05-29 MED ORDER — CEFTRIAXONE SODIUM 250 MG IJ SOLR
250.0000 mg | Freq: Once | INTRAMUSCULAR | Status: AC
  Administered 2018-05-29: 250 mg via INTRAMUSCULAR

## 2018-05-29 MED ORDER — CEFTRIAXONE SODIUM 250 MG IJ SOLR
INTRAMUSCULAR | Status: AC
Start: 2018-05-29 — End: ?
  Filled 2018-05-29: qty 250

## 2018-05-29 NOTE — ED Provider Notes (Signed)
[facility]   [id number] 05/29/18 Arrival Time: 1058   BO:[id number] FOR STD  SUBJECTIVE:  NKENGE MATTHIAS is a 28 y.o. female who presents requesting STI screening.  Currently asymptomatic.  Partner asymptomatic.  Last unprotected sexual encounter 1 day ago.  Condom broke.  Has been sexually active with 1 female partner over the past 6 months.  Denies fever, chills, nausea, vomiting, abdominal or pelvic pain, urinary symptoms, vaginal itching, vaginal odor, vaginal bleeding, dyspareunia, vaginal rashes or lesions.   Patient's last menstrual period was 05/07/2018.   ROS: As per HPI.  Past Medical History:  Diagnosis Date  . Medical history non-contributory   . No pertinent past medical history    Past Surgical History:  Procedure Laterality Date  . NO PAST SURGERIES     No Known Allergies No current facility-administered medications on file prior to encounter.    No current outpatient medications on file prior to encounter.   Social History   Socioeconomic History  . Marital status: Single    Spouse name: Not on file  . Number of children: Not on file  . Years of education: Not on file  . Highest education level: Not on file  Occupational History  . Not on file  Social Needs  . Financial resource strain: Not on file  . Food insecurity:    Worry: Not on file    Inability: Not on file  . Transportation needs:    Medical: Not on file    Non-medical: Not on file  Tobacco Use  . Smoking status: Never Smoker  . Smokeless tobacco: Never Used  Substance and Sexual Activity  . Alcohol use: Yes    Alcohol/week: 1.0 standard drinks    Types: 1 Standard drinks or equivalent per week  . Drug use: No  . Sexual activity: Yes    Partners: Male    Birth control/protection: None    Comment: sometimes condoms  Lifestyle  . Physical activity:    Days per week: Not on file    Minutes per session: Not on file  . Stress: Not on file  Relationships  . Social  connections:    Talks on phone: Not on file    Gets together: Not on file    Attends religious service: Not on file    Active member of club or organization: Not on file    Attends meetings of clubs or organizations: Not on file    Relationship status: Not on file  . Intimate partner violence:    Fear of current or ex partner: Not on file    Emotionally abused: Not on file    Physically abused: Not on file    Forced sexual activity: Not on file  Other Topics Concern  . Not on file  Social History Narrative   [employer] - Engineer, site   Living with her sister   Family History  Problem Relation Age of Onset  . Hypertension Mother   . Diabetes Sister        with pregnancy    OBJECTIVE:  Vitals:   05/29/18 1115 05/29/18 1118  BP: 101/62   Pulse: 88   Resp: 18   Temp: 98.6 F (37 C)   TempSrc: Oral   SpO2: 100%   Weight:  185 lb (83.9 kg)     General appearance: alert, NAD, appears stated age Head: NCAT Throat: lips, mucosa, and tongue normal; teeth and gums normal Lungs: CTA bilaterally without adventitious breath sounds Heart: regular  rate and rhythm.  Radial pulses 2+ symmetrical bilaterally Back: no CVA tenderness Abdomen: soft, non-tender; bowel sounds normal; no masses or organomegaly; no guarding or rebound tenderness GU: deferred Skin: warm and dry Psychological:  Alert and cooperative. Normal mood and affect.  LABS:  Unresulted Labs (From admission, onward)    Start     Ordered   05/29/18 1147  HIV antibody  (STI Exposure)  Once,   STAT     05/29/18 1147   05/29/18 1147  RPR  (STI Exposure)  Once,   STAT     05/29/18 1147          ASSESSMENT & PLAN:  1. Concern about STD in female without diagnosis     Meds ordered this encounter  Medications  . cefTRIAXone (ROCEPHIN) injection 250 mg    Order Specific Question:   Antibiotic Indication:    Answer:   STD  . azithromycin (ZITHROMAX) tablet 1,000 mg    Pending: Labs Reviewed  HIV ANTIBODY  (ROUTINE TESTING W REFLEX)  RPR  CERVICOVAGINAL ANCILLARY ONLY    Given rocephin 250mg  injection and azithromycin 1g in office Vaginal self swab obtained  HIV/ syphilis testing today We will follow up with you regarding the results of your test If tests are positive, please abstain from sexual activity for at least 7 days and notify partners Follow up with PCP if symptoms persists Return here or go to ER if you have any new or worsening symptoms such as fever, chills, nausea, vomiting, abdominal or pelvic pain, urinary symptoms, vaginal itching, vaginal odor, vaginal bleeding, dyspareunia, vaginal rashes or lesions, etc...  Reviewed expectations re: course of current medical issues. Questions answered. Outlined signs and symptoms indicating need for more acute intervention. Patient verbalized understanding. After Visit Summary given.       Rennis Harding, PA-C 05/29/18 1158

## 2018-05-29 NOTE — ED Notes (Signed)
Patient verbalizes understanding of discharge instructions. Opportunity for questioning and answers were provided. Patient discharged from [facility] by RN.  

## 2018-05-29 NOTE — Discharge Instructions (Signed)
Given rocephin 250mg  injection and azithromycin 1g in office Vaginal self swab obtained  HIV/ syphilis testing today We will follow up with you regarding the results of your test If tests are positive, please abstain from sexual activity for at least 7 days and notify partners Follow up with PCP if symptoms persists Return here or go to ER if you have any new or worsening symptoms such as fever, chills, nausea, vomiting, abdominal or pelvic pain, urinary symptoms, vaginal itching, vaginal odor, vaginal bleeding, dyspareunia, vaginal rashes or lesions, etc..Marland Kitchen

## 2018-05-29 NOTE — ED Triage Notes (Signed)
Pt states she wants STD testing. Pt states the condom broke during intercourse that's the reason for testing.

## 2018-05-30 ENCOUNTER — Telehealth (HOSPITAL_COMMUNITY): Payer: Self-pay | Admitting: Emergency Medicine

## 2018-05-30 LAB — CERVICOVAGINAL ANCILLARY ONLY
Bacterial vaginitis: POSITIVE — AB
Candida vaginitis: NEGATIVE
Chlamydia: NEGATIVE
Neisseria Gonorrhea: NEGATIVE
Trichomonas: NEGATIVE

## 2018-05-30 LAB — HIV ANTIBODY (ROUTINE TESTING W REFLEX): HIV Screen 4th Generation wRfx: NONREACTIVE

## 2018-05-30 LAB — RPR: RPR Ser Ql: NONREACTIVE

## 2018-05-30 MED ORDER — METRONIDAZOLE 500 MG PO TABS: 500.0000 mg | ORAL_TABLET | Freq: Two times a day (BID) | ORAL | 0 refills | Status: AC

## 2018-05-30 NOTE — Telephone Encounter (Signed)
Bacterial vaginosis is positive. This was not treated at the urgent care visit.  Flagyl 500 mg BID x 7 days #14 no refills sent to patients pharmacy of choice.    Patient contacted and made aware of all results, all questions answered.  

## 2018-06-11 ENCOUNTER — Other Ambulatory Visit: Payer: Self-pay

## 2018-06-11 ENCOUNTER — Ambulatory Visit (INDEPENDENT_AMBULATORY_CARE_PROVIDER_SITE_OTHER): Payer: Medicaid Other | Admitting: Family Medicine

## 2018-06-11 VITALS — BP 112/72 | HR 88 | Temp 98.2°F | Wt 184.2 lb

## 2018-06-11 DIAGNOSIS — L732 Hidradenitis suppurativa: Secondary | ICD-10-CM

## 2018-06-11 MED ORDER — CLINDAMYCIN PHOSPHATE 1 % EX SOLN: Freq: Two times a day (BID) | CUTANEOUS | 0 refills | Status: DC

## 2018-06-11 NOTE — Patient Instructions (Signed)
Hidradenitis Suppurativa Hidradenitis suppurativa is a long-term (chronic) skin disease. It is similar to a severe form of acne, but it affects areas of the body where acne would be unusual, especially areas of the body where skin rubs against skin and becomes moist. These include:  Underarms.  Groin.  Genital area.  Buttocks.  Upper thighs.  Breasts. Hidradenitis suppurativa may start out as small lumps or pimples caused by blocked sweat glands or hair follicles. Pimples may develop into deep sores that break open (rupture) and drain pus. Over time, affected areas of skin may thicken and become scarred. This condition is rare and does not spread from person to person (non-contagious). What are the causes? The exact cause of this condition is not known. It may be related to:  Female and female hormones.  An overactive disease-fighting system (immune system). The immune system may over-react to blocked hair follicles or sweat glands and cause swelling and pus-filled sores. What increases the risk? You are more likely to develop this condition if you:  Are female.  Are 11-55 years old.  Have a family history of hidradenitis suppurativa.  Have a personal history of acne.  Are overweight.  Smoke.  Take the medicine lithium. What are the signs or symptoms? The first symptoms are usually painful bumps in the skin, similar to pimples. The condition may get worse over time (progress), or it may only cause mild symptoms. If the disease progresses, symptoms may include:  Skin bumps getting bigger and growing deeper into the skin.  Bumps rupturing and draining pus.  Itchy, infected skin.  Skin getting thicker and scarred.  Tunnels under the skin (fistulas) where pus drains from a bump.  Pain during daily activities, such as pain during walking if your groin area is affected.  Emotional problems, such as stress or depression. This condition may affect your appearance and your  ability or willingness to wear certain clothes or do certain activities. How is this diagnosed? This condition is diagnosed by a health care provider who specializes in skin diseases (dermatologist). You may be diagnosed based on:  Your symptoms and medical history.  A physical exam.  Testing a pus sample for infection.  Blood tests. How is this treated? Your treatment will depend on how severe your symptoms are. The same treatment will not work for everybody with this condition. You may need to try several treatments to find what works best for you. Treatment may include:  Cleaning and bandaging (dressing) your wounds as needed.  Lifestyle changes, such as new skin care routines.  Taking medicines, such as: ? Antibiotics. ? Acne medicines. ? Medicines to reduce the activity of the immune system. ? A diabetes medicine (metformin). ? Birth control pills, for women. ? Steroids to reduce swelling and pain.  Working with a mental health care provider, if you experience emotional distress due to this condition. If you have severe symptoms that do not get better with medicine, you may need surgery. Surgery may involve:  Using a laser to clear the skin and remove hair follicles.  Opening and draining deep sores.  Removing the areas of skin that are diseased and scarred. Follow these instructions at home: Medicines   Take over-the-counter and prescription medicines only as told by your health care provider.  If you were prescribed an antibiotic medicine, take it as told by your health care provider. Do not stop taking the antibiotic even if your condition improves. Skin care  If you have open wounds, cover   them with a clean dressing as told by your health care provider. Keep wounds clean by washing them gently with soap and water when you bathe.  Do not shave the areas where you get hidradenitis suppurativa.  Do not wear deodorant.  Wear loose-fitting clothes.  Try to avoid  getting overheated or sweaty. If you get sweaty or wet, change into clean, dry clothes as soon as you can.  To help relieve pain and itchiness, cover sore areas with a warm, clean washcloth (warm compress) for 5-10 minutes as often as needed.  If told by your health care provider, take a bleach bath twice a week: ? Fill your bathtub halfway with water. ? Pour in  cup of unscented household bleach. ? Soak in the tub for 5-10 minutes. ? Only soak from the neck down. Avoid water on your face and hair. ? Shower to rinse off the bleach from your skin. General instructions  Learn as much as you can about your disease so that you have an active role in your treatment. Work closely with your health care provider to find treatments that work for you.  If you are overweight, work with your health care provider to lose weight as recommended.  Do not use any products that contain nicotine or tobacco, such as cigarettes and e-cigarettes. If you need help quitting, ask your health care provider.  If you struggle with living with this condition, talk with your health care provider or work with a mental health care provider as recommended.  Keep all follow-up visits as told by your health care provider. This is important. Where to find more information  Hidradenitis Suppurativa Foundation, Inc.: https://www.hs-foundation.org/ Contact a health care provider if you have:  A flare-up of hidradenitis suppurativa.  A fever or chills.  Trouble controlling your symptoms at home.  Trouble doing your daily activities because of your symptoms.  Trouble dealing with emotional problems related to your condition. Summary  Hidradenitis suppurativa is a long-term (chronic) skin disease. It is similar to a severe form of acne, but it affects areas of the body where acne would be unusual.  The first symptoms are usually painful bumps in the skin, similar to pimples. The condition may get worse over time  (progress), or it may only cause mild symptoms.  If you have open wounds, cover them with a clean dressing as told by your health care provider. Keep wounds clean by washing them gently with soap and water when you bathe.  Besides skin care, treatment may include medicines, laser treatment, and surgery. This information is not intended to replace advice given to you by your health care provider. Make sure you discuss any questions you have with your health care provider. Document Released: 09/27/2003 Document Revised: 02/20/2017 Document Reviewed: 02/20/2017 Elsevier Interactive Patient Education  2019 Elsevier Inc.   [employer] Therapy Heat therapy can help ease sore, stiff, injured, and tight muscles and joints. Heat relaxes your muscles, which may help ease your pain and muscle spasms. Do not use heat therapy unless your doctor tells you to use it. How to use heat therapy There are several different kinds of heat therapy, including:  Moist heat pack.  Hot water bottle.  Electric heating pad.  Heated gel pack.  Heated wrap.  Warm water bath. Your doctor will tell you how to use heat therapy. In general, you should: 1. Place a towel between your skin and the heat source. 2. Leave the heat on for 20-30 minutes. Your skin  may turn pink. 3. Remove the heat if your skin turns bright red. You should remove the heat source if you are unable to feel pain, heat, or cold. You are more likely to get burned if you leave it on the skin for too long. Your doctor may also tell you to take a warm water bath. To do this: 1. Put a non-slip pad in the bathtub to prevent a fall. 2. Fill the bathtub with warm water. 3. Check the water temperature. 4. Soak in the water for 15-20 minutes, or as told by your doctor. 5. Be careful when you stand up after the bath. You may feel dizzy. 6. Pat yourself dry after the bath. Do not rub your skin to dry it. General recommendations for heat therapy  Do not sleep  while using heat therapy. Only use heat therapy while you are awake.  Your skin may turn pink while using heat therapy. Do not use heat therapy if your skin turns red.  Do not use heat therapy if you have a new injury.  High heat or using heat for a long time can cause burns. Be careful not to burn your skin when using heat therapy.  Do not use heat therapy on areas of your skin that are already irritated, such as with a rash or sunburn. Get help if you have:  Blisters, redness, swelling (puffiness), or numbness.  New pain.  Pain that is getting worse. Summary  Heat therapy is the use of heat to help ease sore, stiff, injured, and tight muscles and joints.  There are different types of heat therapy. Your doctor will tell you which one to use.  Only use heat therapy while you are awake.  Watch your skin to make sure you do not get burned while using heat therapy. This information is not intended to replace advice given to you by your health care provider. Make sure you discuss any questions you have with your health care provider. Document Released: 05/07/2011 Document Revised: 02/23/2017 Document Reviewed: 02/23/2017 Elsevier Interactive Patient Education  2019 [employer]

## 2018-06-11 NOTE — Assessment & Plan Note (Signed)
Patient presented with a developing left axilla boil for the past 2 days. Lesion is tender to palpation and has restricted motion from that arm. Patient just started heat therapy this morning. On exam, there is no fluctuance or drainage noted. It appears this is a forming abscess most consistent with hidradenitis given history and location. Recommended continuing warm compresses for the next few days to help abscess come to her head. Based on my exam this is a Hurley stage 1 and she will not required po antibiotics. I will prescribe a topical solution for her to use in the next 7 days in addition to compresses. If no improvement patient will be reevaluated to assess for need  For I&D. We have also discuss avoid skin tear with shaving which could be the reason for the developing abscess. She can take Ibuprofen for her pain. --Prescribe Clindamycin solution 1% bid for the next 7 days. --Follow up as needed

## 2018-06-11 NOTE — Progress Notes (Signed)
   Subjective:    Patient ID: Jill Rush, female    DOB: 1990/06/02, 28 y.o.   MRN: [id number]   CC: Boil under arm   HPI: Patient presents today complaining of developing and painful boil in her left armpit. Patient report that lesion has been there for the past two days. She reports it has been growing and is very painful with any motion of her arm. Patient reports that she has had similar issue in the past but they usually self resolved and have not been as painful. He sister has the same issue. She has try to do some warm compresses for the first time this morning. She has not taken any medication for the pain. Patient denies any fevers, chills, nausea, vomiting.  Smoking status reviewed   ROS: all other systems were reviewed and are negative other than in the HPI   Past Medical History:  Diagnosis Date  . Medical history non-contributory   . No pertinent past medical history     Past Surgical History:  Procedure Laterality Date  . NO PAST SURGERIES      Past medical history, surgical, family, and social history reviewed and updated in the EMR as appropriate.  Objective:  BP 112/72   Pulse 88   Temp 98.2 F (36.8 C) (Oral)   Wt 184 lb 3.2 oz (83.6 kg)   SpO2 99%   BMI 35.97 kg/m   Vitals and nursing note reviewed     General: NAD, pleasant, able to participate in exam Cardiac: RRR, normal heart sounds, no murmurs. 2+ radial and PT pulses bilaterally Respiratory: CTAB, normal effort, No wheezes, rales or rhonchi Abdomen: soft, nontender, nondistended, no hepatic or splenomegaly, +BS Extremities: no edema or cyanosis. WWP. Skin: 4 cm longitudinal lesion noted under left axilla, no fluctuance or drainage. Tender to palpation. No surrounding erythema.  Neuro: alert and oriented x4, no focal deficits Psych: Normal affect and mood   Assessment & Plan:   Hidradenitis Patient presented with a developing left axilla boil for the past 2 days. Lesion is tender  to palpation and has restricted motion from that arm. Patient just started heat therapy this morning. On exam, there is no fluctuance or drainage noted. It appears this is a forming abscess most consistent with hidradenitis given history and location. Recommended continuing warm compresses for the next few days to help abscess come to her head. Based on my exam this is a Hurley stage 1 and she will not required po antibiotics. I will prescribe a topical solution for her to use in the next 7 days in addition to compresses. If no improvement patient will be reevaluated to assess for need  For I&D. We have also discuss avoid skin tear with shaving which could be the reason for the developing abscess. She can take Ibuprofen for her pain. --Prescribe Clindamycin solution 1% bid for the next 7 days. --Follow up as needed    Lovena Neighbours, MD [facility] PGY-3

## 2018-06-24 ENCOUNTER — Encounter: Payer: Self-pay | Admitting: Family Medicine

## 2018-06-24 ENCOUNTER — Other Ambulatory Visit: Payer: Self-pay

## 2018-06-24 ENCOUNTER — Ambulatory Visit (INDEPENDENT_AMBULATORY_CARE_PROVIDER_SITE_OTHER): Payer: Medicaid Other | Admitting: Family Medicine

## 2018-06-24 VITALS — BP 122/80 | HR 72

## 2018-06-24 DIAGNOSIS — Z30011 Encounter for initial prescription of contraceptive pills: Secondary | ICD-10-CM | POA: Diagnosis not present

## 2018-06-24 DIAGNOSIS — B373 Candidiasis of vulva and vagina: Secondary | ICD-10-CM | POA: Diagnosis not present

## 2018-06-24 DIAGNOSIS — B3731 Acute candidiasis of vulva and vagina: Secondary | ICD-10-CM

## 2018-06-24 DIAGNOSIS — N898 Other specified noninflammatory disorders of vagina: Secondary | ICD-10-CM | POA: Diagnosis not present

## 2018-06-24 LAB — POCT WET PREP (WET MOUNT)
Clue Cells Wet Prep Whiff POC: NEGATIVE
Trichomonas Wet Prep HPF POC: ABSENT

## 2018-06-24 MED ORDER — NORGESTIMATE-ETH ESTRADIOL 0.25-35 MG-MCG PO TABS: 1.0000 | ORAL_TABLET | Freq: Every day | ORAL | 4 refills | Status: DC

## 2018-06-24 MED ORDER — FLUCONAZOLE 150 MG PO TABS: 150.0000 mg | ORAL_TABLET | Freq: Once | ORAL | 1 refills | Status: AC

## 2018-06-24 NOTE — Assessment & Plan Note (Signed)
Classic presentation.  Treat with diflucan.  Had recent STD testing and no concerns so did not repeat

## 2018-06-24 NOTE — Assessment & Plan Note (Signed)
Wishes to start BCP.  Had headaches she thinks before when took but unsure of brand or timing.  Will start with Sprintec and monitor.  Handout given

## 2018-06-24 NOTE — Patient Instructions (Addendum)
Good to see you today!  Thanks for coming in.  You do have a yeast infection.  Take the diflucan one tab.  You can repeat in one week if needed.  If the discharge does not go away please call  Start the BCP the Sunday of your next menstrual period.   Take them the same time each day

## 2018-06-24 NOTE — Progress Notes (Signed)
sprSubjective  Jill Rush is a 28 y.o. female is presenting with the following  VAGINAL DISCHARGE  Having vaginal discharge for several days. Discharge consistency: thick Discharge color: white Medications tried: none  Recent antibiotic use: no Sex in last month: no Possible STD exposure:no  Symptoms Fever: no Dysuria:no Vaginal bleeding: no Abdomen or Pelvic pain: no Back pain: no Genital sores or ulcers:no Rash: no Pain during sex: no Missed menstrual period: no  ROS see HPI Smoking Status noted  CONTRACEPTION Also wishes to restart bcp.  Can't remember name of last one.  Thinks might have caused headache other wise no problems taking them   Chief Complaint noted Review of Symptoms - see HPI PMH - Smoking status noted.    Objective Vital Signs reviewed BP 122/80   Pulse 72   LMP 06/05/2018   SpO2 98%  Genitalia:  Normal introitus for age, no external lesions, thick white vaginal discharge, mucosa pink and moist, no vaginal or cervical lesions, no vaginal atrophy, no friaility or hemorrhage,    Assessments/Plans  See after visit summary for details of patient instuctions  Vaginal yeast infection Classic presentation.  Treat with diflucan.  Had recent STD testing and no concerns so did not repeat   Contraception management Wishes to start BCP.  Had headaches she thinks before when took but unsure of brand or timing.  Will start with Sprintec and monitor.  Handout given

## 2018-09-03 IMAGING — DX DG CHEST 2V
2 series · 2 of 2 positions shown · non-contrast
Comparison: Radiographs May 14, 2014.

CLINICAL DATA: Chest pain.

EXAM:
CHEST  2 VIEW

[chest pa]
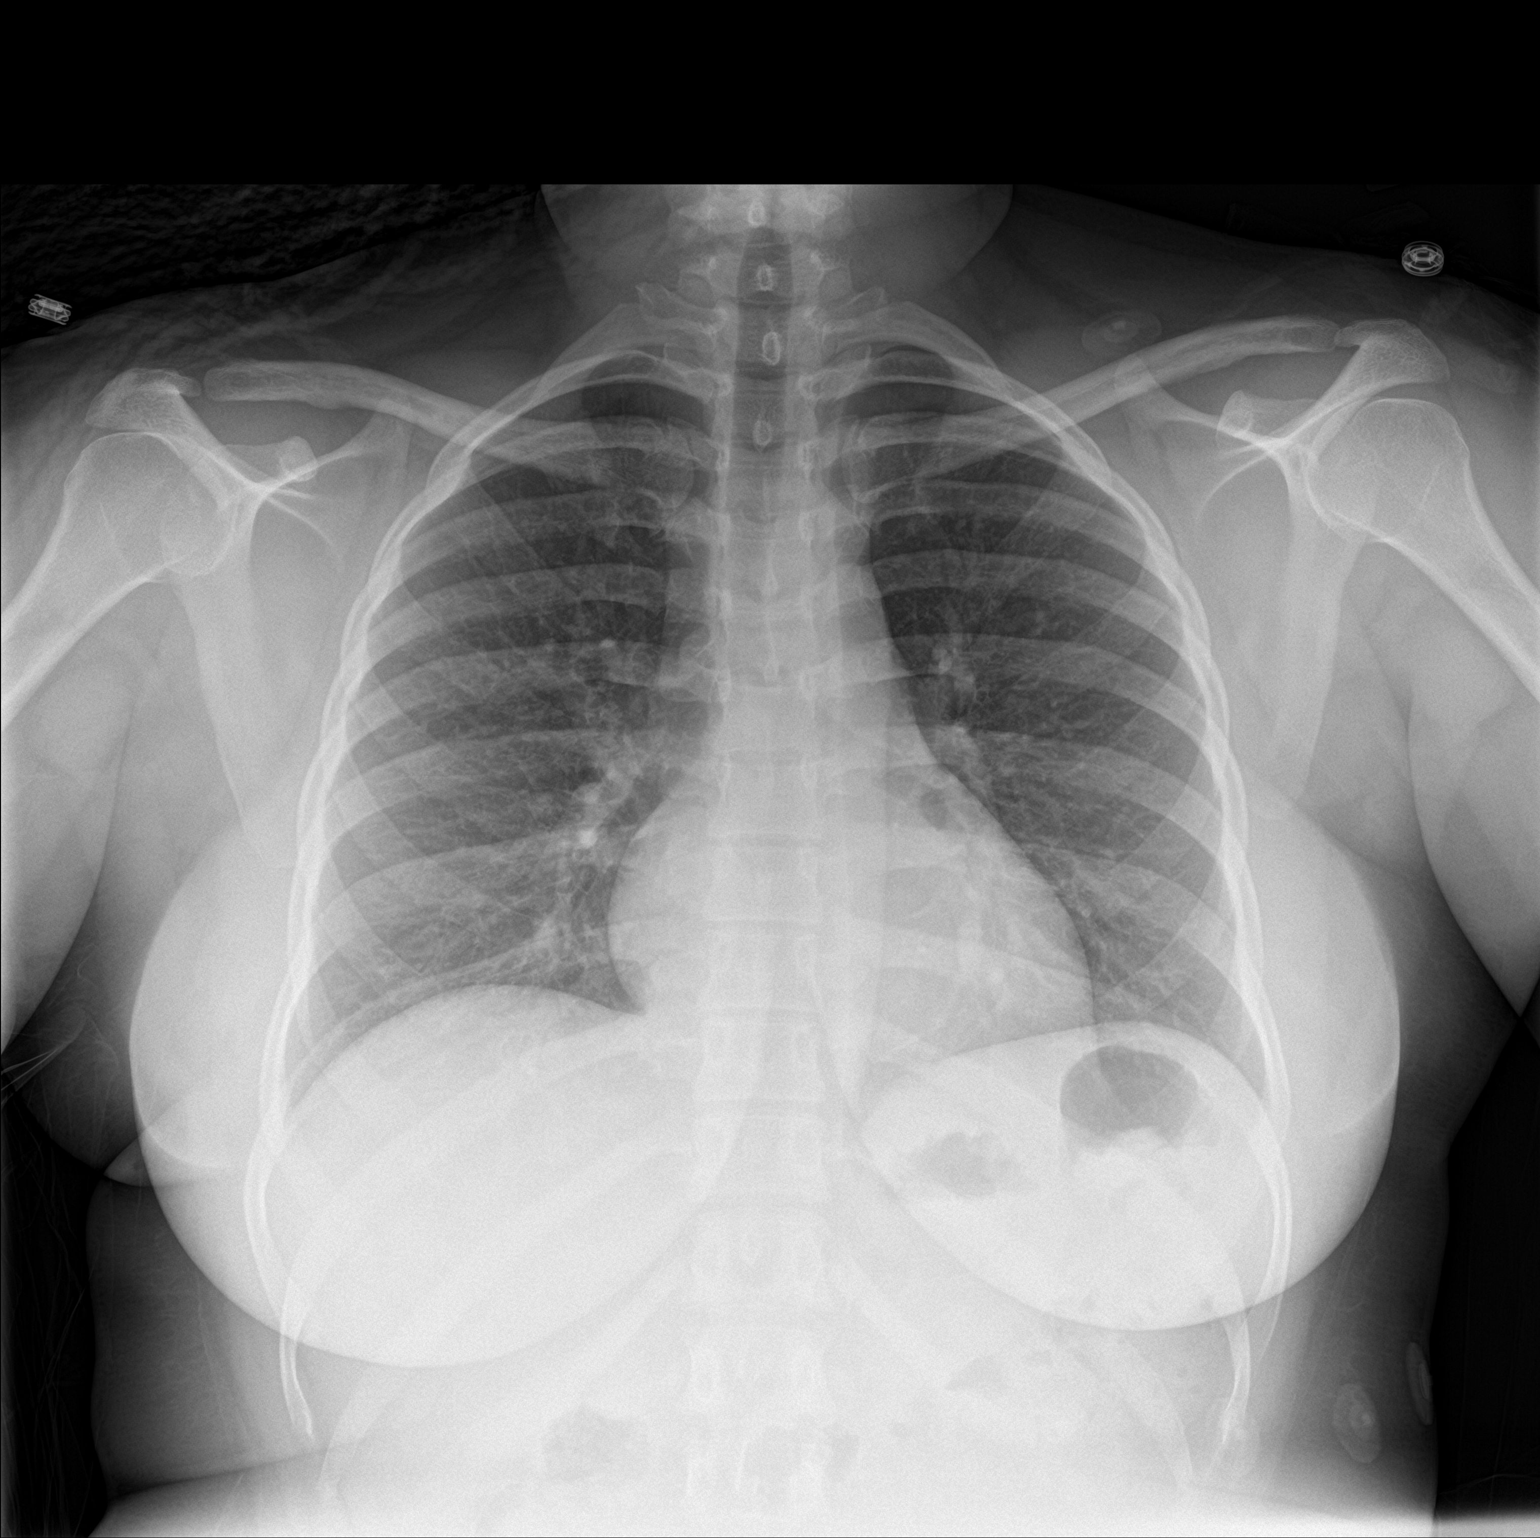

[chest lat]
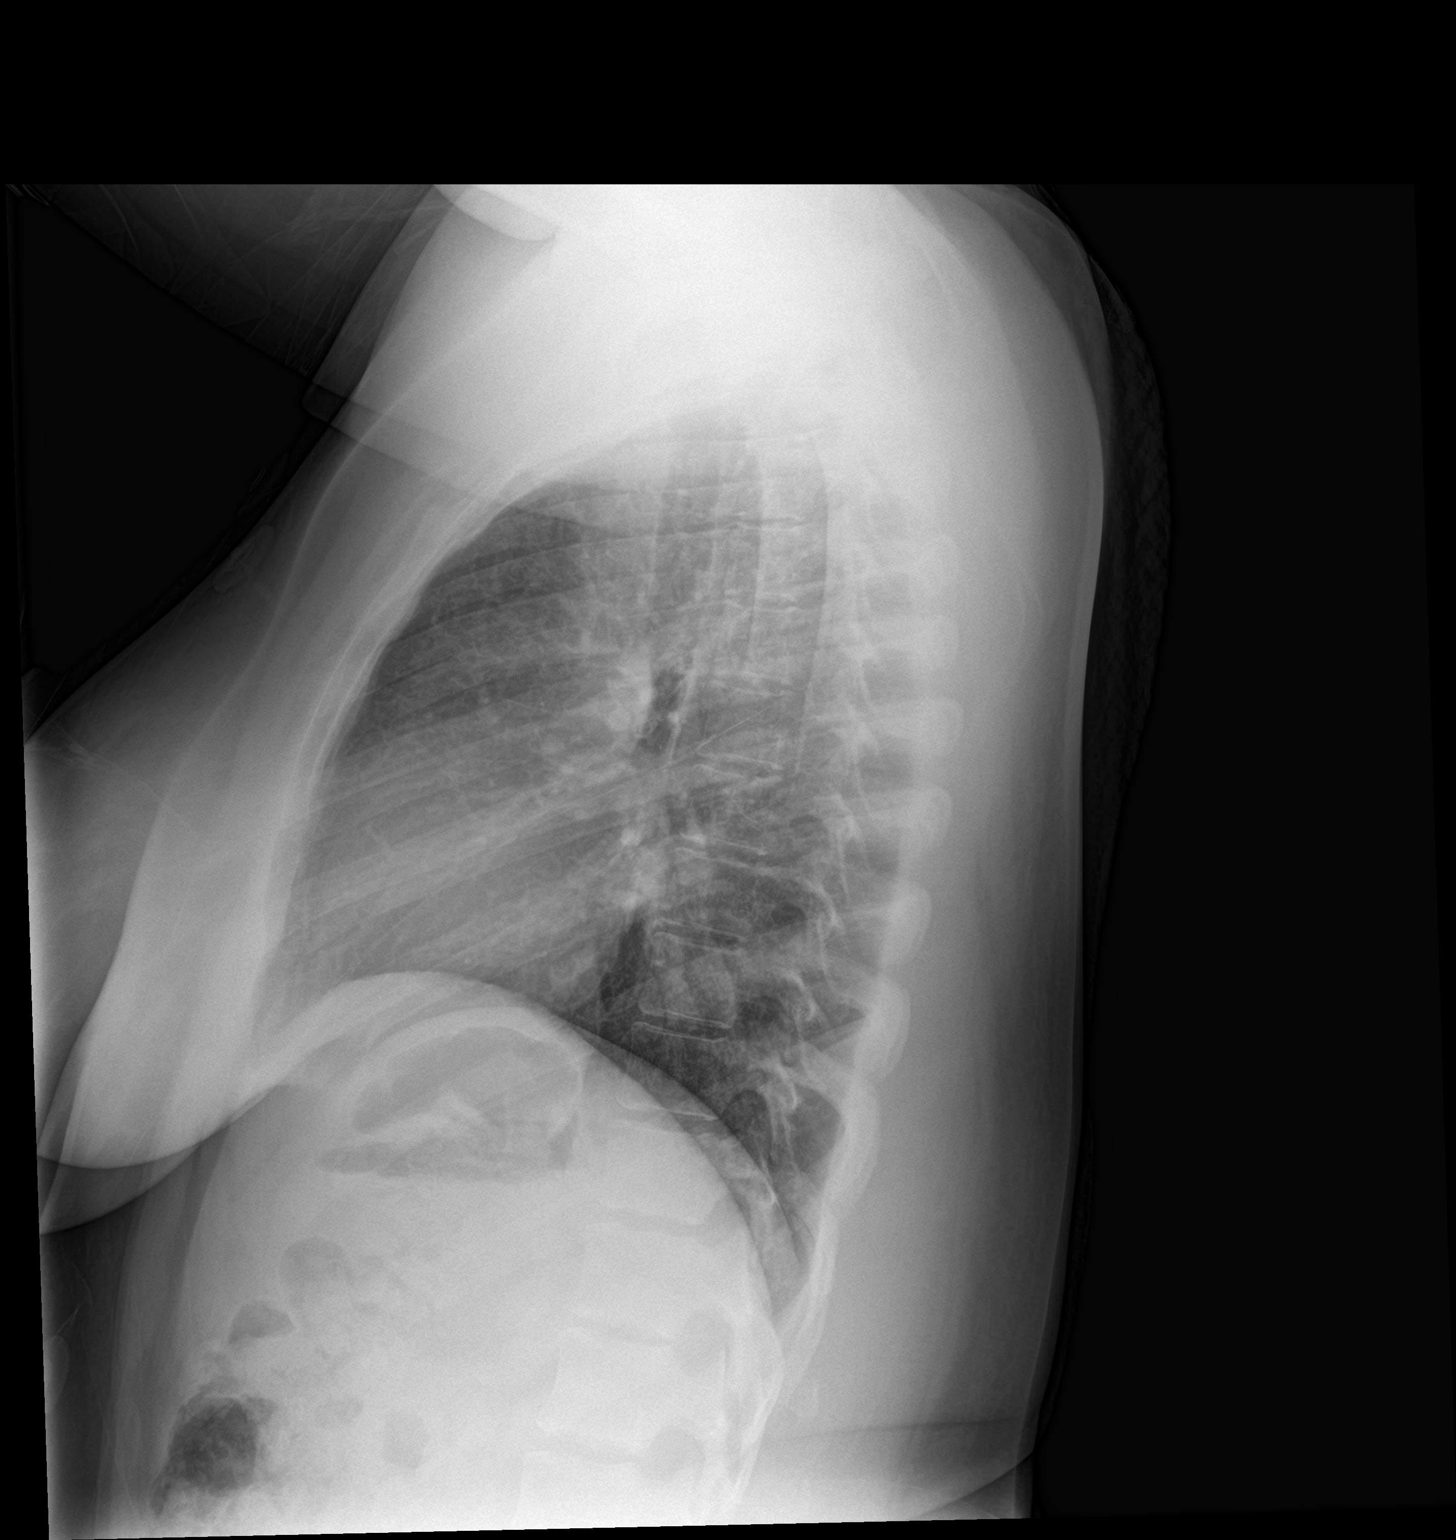

[2 of 2 positions shown; findings below may reference images not displayed]

FINDINGS: The heart size and mediastinal contours are within normal limits.
Both lungs are clear. No pneumothorax or pleural effusion is noted.
The visualized skeletal structures are unremarkable.
IMPRESSION: No active cardiopulmonary disease.

## 2018-11-28 ENCOUNTER — Ambulatory Visit (INDEPENDENT_AMBULATORY_CARE_PROVIDER_SITE_OTHER): Payer: Medicaid Other | Admitting: Family Medicine

## 2018-11-28 ENCOUNTER — Other Ambulatory Visit (HOSPITAL_COMMUNITY)
Admission: RE | Admit: 2018-11-28 | Discharge: 2018-11-28 | Disposition: A | Discharge status: Home / Self Care | Payer: Medicaid Other | Source: Ambulatory Visit | Attending: Family Medicine | Admitting: Family Medicine

## 2018-11-28 ENCOUNTER — Other Ambulatory Visit: Payer: Self-pay

## 2018-11-28 VITALS — BP 108/64 | HR 81 | Wt 162.0 lb

## 2018-11-28 DIAGNOSIS — N760 Acute vaginitis: Secondary | ICD-10-CM

## 2018-11-28 DIAGNOSIS — B9689 Other specified bacterial agents as the cause of diseases classified elsewhere: Secondary | ICD-10-CM | POA: Insufficient documentation

## 2018-11-28 DIAGNOSIS — Z113 Encounter for screening for infections with a predominantly sexual mode of transmission: Secondary | ICD-10-CM | POA: Insufficient documentation

## 2018-11-28 DIAGNOSIS — Z23 Encounter for immunization: Secondary | ICD-10-CM | POA: Diagnosis not present

## 2018-11-28 LAB — POCT WET PREP (WET MOUNT)
Clue Cells Wet Prep Whiff POC: POSITIVE
Trichomonas Wet Prep HPF POC: ABSENT

## 2018-11-28 MED ORDER — METRONIDAZOLE 500 MG PO TABS: 500.0000 mg | ORAL_TABLET | Freq: Two times a day (BID) | ORAL | 0 refills | Status: AC

## 2018-11-28 NOTE — Assessment & Plan Note (Signed)
Wet prep c/w bacterial vaginosis.  Flagyl 500mg  BID x 7 days.

## 2018-11-28 NOTE — Progress Notes (Signed)
     Subjective: Chief Complaint  Patient presents with  . STD Check     HPI: Jill Rush is a 28 y.o. presenting to clinic today to discuss the following:  1 STD Testing No vaginal complaints, just "wants to get checked out because it has been awhile."  No dysuria, no vaginal lesions.  No known STD exposure.  Has been sexually active.  Doesn't use protection all the time.  LMP 9/23.  Not currently taking birth control, would be okay with having a child.  Health Maintenance: UTD on pap, getting flu shot and Tdap today     ROS noted in HPI. Chief complaint noted.  Other Pertinent PMH: No pertinent Past Medical, Surgical, Social, and Family History Reviewed & Updated per EMR.      Social History   Tobacco Use  Smoking Status Never Smoker  Smokeless Tobacco Never Used   Smoking status noted.    Objective: BP 108/64   Pulse 81   Wt 162 lb (73.5 kg)   LMP 11/19/2018 (Exact Date)   SpO2 96%   BMI 31.64 kg/m  Vitals and nursing notes reviewed  Physical Exam:  General: 28 y.o. female in NAD Lungs: Breathing comfortably on RA Abdomen: Soft, non-tender to palpation, no CVA tenders Skin: warm and dry Extremities: No edema Pelvic exam performed with patient supine.  Chaperone in room.  Bilateral labia without abnormalities, no inguinal LAD palpated.  Cervix exhibits no discharge, no cervix abnormalities.  No vaginal lesions.  Vaginal discharge scant, physiologic.   Results for orders placed or performed in visit on 11/28/18 (from the past 72 hour(s))  POCT Wet Prep [facility])     Status: Abnormal   Collection Time: 11/28/18 11:18 AM  Result Value Ref Range   Source Wet Prep POC VAG    WBC, Wet Prep HPF POC 0-3    Bacteria Wet Prep HPF POC Few Few   Clue Cells Wet Prep HPF POC Few (A) None   Clue Cells Wet Prep Whiff POC Positive Whiff    Yeast Wet Prep HPF POC None None   Trichomonas Wet Prep HPF POC Absent Absent    Assessment/Plan:  Encounter for  screening examination for sexually transmitted disease No concerns on examination.  Condoms provided.  Declines other birth control and notes she might want to become pregnant soon.  Wet prep peformed.  Will f/u G/C, HIV, RPR.  Bacterial vaginosis Wet prep c/w bacterial vaginosis.  Flagyl 500mg  BID x 7 days.     PATIENT EDUCATION PROVIDED: See AVS    Diagnosis and plan along with any newly prescribed medication(s) were discussed in detail with this patient today. The patient verbalized understanding and agreed with the plan. Patient advised if symptoms worsen return to clinic or ER.   Health Maintainance: Flu shot and Tdap given today   Orders Placed This Encounter  Procedures  . RPR  . HIV antibody (with reflex)  . POCT Wet Prep [facility])    Meds ordered this encounter  Medications  . metroNIDAZOLE (FLAGYL) 500 MG tablet    Sig: Take 1 tablet (500 mg total) by mouth 2 (two) times daily for 7 days.    Dispense:  14 tablet    Refill:  [place], DO 11/28/2018, 11:39 AM PGY-2 [place]

## 2018-11-28 NOTE — Addendum Note (Signed)
Addended by: Tacy Dura RUMPLE, Bethanie Bloxom D on: 11/28/2018 11:45 AM   Modules accepted: Orders, SmartSet

## 2018-11-28 NOTE — Patient Instructions (Addendum)
Thank you for coming to see me today. It was a pleasure. Today we talked about:   STD testing: You have bacterial vaginosis, therefore we will treat that with Flagyl twice a day for 7 days.  The results of your other tests will take a few days.  We will call you if they are abnormal, otherwise we will release on mychart.  Flu shot: your arm can be sore and you can feel a little crummy.  That is normal.  You can take tylenol or ibuprofen as needed for the pain.   If you have any questions or concerns, please do not hesitate to call the office at [phone].  Best,   [place] Constable, DO

## 2018-11-28 NOTE — Assessment & Plan Note (Addendum)
No concerns on examination.  Condoms provided.  Declines other birth control and notes she might want to become pregnant soon.  Wet prep peformed.  Will f/u G/C, HIV, RPR.

## 2018-12-02 LAB — CERVICOVAGINAL ANCILLARY ONLY
Chlamydia: NEGATIVE
Neisseria Gonorrhea: NEGATIVE

## 2019-03-10 ENCOUNTER — Other Ambulatory Visit: Payer: Self-pay

## 2019-03-10 ENCOUNTER — Ambulatory Visit (INDEPENDENT_AMBULATORY_CARE_PROVIDER_SITE_OTHER): Payer: Medicaid Other | Admitting: Family Medicine

## 2019-03-10 ENCOUNTER — Other Ambulatory Visit (HOSPITAL_COMMUNITY)
Admission: RE | Admit: 2019-03-10 | Discharge: 2019-03-10 | Disposition: A | Discharge status: Home / Self Care | Payer: Medicaid Other | Source: Ambulatory Visit | Attending: Family Medicine | Admitting: Family Medicine

## 2019-03-10 VITALS — BP 105/70 | HR 71

## 2019-03-10 DIAGNOSIS — N912 Amenorrhea, unspecified: Secondary | ICD-10-CM

## 2019-03-10 DIAGNOSIS — N898 Other specified noninflammatory disorders of vagina: Secondary | ICD-10-CM | POA: Diagnosis not present

## 2019-03-10 DIAGNOSIS — Z113 Encounter for screening for infections with a predominantly sexual mode of transmission: Secondary | ICD-10-CM | POA: Diagnosis present

## 2019-03-10 LAB — POCT WET PREP (WET MOUNT)
Clue Cells Wet Prep Whiff POC: POSITIVE
Trichomonas Wet Prep HPF POC: ABSENT

## 2019-03-10 LAB — POCT URINE PREGNANCY: Preg Test, Ur: NEGATIVE

## 2019-03-10 MED ORDER — FLUCONAZOLE 150 MG PO TABS: ORAL_TABLET | ORAL | 0 refills | Status: DC

## 2019-03-10 MED ORDER — METRONIDAZOLE 500 MG PO TABS: 500.0000 mg | ORAL_TABLET | Freq: Two times a day (BID) | ORAL | 0 refills | Status: AC

## 2019-03-10 NOTE — Patient Instructions (Signed)

## 2019-03-10 NOTE — Progress Notes (Signed)
   Subjective:    Patient ID: Jill Rush, female    DOB: 05/21/1990, 29 y.o.   MRN: [id number]   CC: vaginal discharge and desire for STD testing   HPI: VAGINAL DISCHARGE  Onset: 1 week    Description: thin, white/brown   Odor: none   Itching: none   Symptoms Dysuria: no  Bleeding: no  Pelvic pain: no  Back pain: no  Fever: no  Genital sores: no  Rash: no  Dyspareunia: no  GI Sxs: no  Prior treatment: no   Red Flags: Missed period: no  Pregnancy: is concerned and would like a pregnancy test   Recent antibiotics: recently took metronidazole in October but did not finish course. Does report to getting frequent yeast infections after taking antibiotics  Sexual activity: yes, men, same partner x2 years. Uses barrier protection intermittently   Possible STD exposure: yes  IUD: no  Diabetes: no   Patient reports she would also like STD testing today.  Both blood and vaginal swabs.   Objective:  BP 105/70   Pulse 71   LMP 02/11/2019 (Exact Date)   SpO2 99%  Vitals and nursing note reviewed  General: well nourished, in no acute distress HEENT: normocephalic  Neck: supple  Cardiac: Regular rate Respiratory: no increased work of breathing Extremities: no edema or cyanosis. Warm, well perfused.  Skin: warm and dry, no rashes noted Neuro: alert and oriented, no focal deficits Female genitalia: normal external genitalia, vulva, vagina, cervix, uterus and adnexa, thick white discharge noted   Assessment & Plan:    Vaginal discharge Patient with vaginal discharge.  Physical exam appears consistent with yeast infection.  Wet prep shows Positive whiff test and many clue cells.  Will give prescription for metronidazole 500mg  bid x7 days. Will also give RX for diflucan as patient reports getting frequent yeast infections after taking antibiotics.  Patient reported to me that she is not on any contraception.  Is not trying to have a baby but states if she were to get  pregnant it will be desired.  I advised her to start taking prenatal vitamin if she is thinking about possibly getting pregnant.  Patient would like a pregnancy test today.  Urine pregnancy test negative today. Patient to follow-up if no improvement in 2 weeks.  Strict return precautions given.    Return in about 2 weeks (around 03/24/2019), or if symptoms worsen or fail to improve.   03/26/2019, DO, PGY-3

## 2019-03-10 NOTE — Assessment & Plan Note (Addendum)
Patient with vaginal discharge.  Physical exam appears consistent with yeast infection.  Wet prep shows Positive whiff test and many clue cells.  Will give prescription for metronidazole 500mg  bid x7 days. Will also give RX for diflucan as patient reports getting frequent yeast infections after taking antibiotics.  Patient reported to me that she is not on any contraception.  Is not trying to have a baby but states if she were to get pregnant it will be desired.  I advised her to start taking prenatal vitamin if she is thinking about possibly getting pregnant.  Patient would like a pregnancy test today.  Urine pregnancy test negative today. Patient to follow-up if no improvement in 2 weeks.  Strict return precautions given.

## 2019-03-12 LAB — CERVICOVAGINAL ANCILLARY ONLY
Chlamydia: NEGATIVE
Comment: NEGATIVE
Comment: NORMAL
Neisseria Gonorrhea: NEGATIVE

## 2019-06-04 ENCOUNTER — Other Ambulatory Visit (HOSPITAL_COMMUNITY)
Admission: RE | Admit: 2019-06-04 | Discharge: 2019-06-04 | Disposition: A | Discharge status: Home / Self Care | Payer: Medicaid Other | Source: Ambulatory Visit | Attending: Family Medicine | Admitting: Family Medicine

## 2019-06-04 ENCOUNTER — Ambulatory Visit (INDEPENDENT_AMBULATORY_CARE_PROVIDER_SITE_OTHER): Payer: Medicaid Other | Admitting: Family Medicine

## 2019-06-04 ENCOUNTER — Other Ambulatory Visit: Payer: Self-pay

## 2019-06-04 VITALS — BP 122/80 | HR 89 | Ht 60.0 in | Wt 156.2 lb

## 2019-06-04 DIAGNOSIS — N898 Other specified noninflammatory disorders of vagina: Secondary | ICD-10-CM

## 2019-06-04 DIAGNOSIS — B3731 Acute candidiasis of vulva and vagina: Secondary | ICD-10-CM

## 2019-06-04 DIAGNOSIS — Z23 Encounter for immunization: Secondary | ICD-10-CM | POA: Diagnosis not present

## 2019-06-04 DIAGNOSIS — B373 Candidiasis of vulva and vagina: Secondary | ICD-10-CM

## 2019-06-04 DIAGNOSIS — Z113 Encounter for screening for infections with a predominantly sexual mode of transmission: Secondary | ICD-10-CM

## 2019-06-04 LAB — POCT WET PREP (WET MOUNT)
Clue Cells Wet Prep Whiff POC: NEGATIVE
Trichomonas Wet Prep HPF POC: ABSENT
WBC, Wet Prep HPF POC: >20

## 2019-06-04 LAB — POCT URINE PREGNANCY: Preg Test, Ur: NEGATIVE

## 2019-06-04 MED ORDER — FLUCONAZOLE 150 MG PO TABS: ORAL_TABLET | ORAL | 0 refills | Status: DC

## 2019-06-04 NOTE — Assessment & Plan Note (Signed)
Patient's care gaps in epic showing patient is due for DTaP vaccine.  Patient skipped a DTaP vaccine as a child, is currently up-to-date on tetanus vaccine. -We will contact epic administration to have flag removed.

## 2019-06-04 NOTE — Assessment & Plan Note (Signed)
Patient's wet prep today consistent with yeast, positive KOH.  Urine pregnancy test today negative, patient just ended her cycle. -Diflucan 150 mg tablet for yeast infection

## 2019-06-04 NOTE — Progress Notes (Signed)
    SUBJECTIVE:   CHIEF COMPLAINT / HPI: Vaginal itchiness  Concern for yeast infection/STI check: Patient presents to the clinic today for concerns of yeast infection.  States that her period just ended.  For the last couple of days she has been experiencing vaginal itch, as well as thin white discharge.  Denies vaginal odor and burning in the area.  States that the vaginal area feels "raw and sore".  Would also like to be checked for STIs.  Current concerns include vaginal itch, thin white discharge, no vaginal odor, no burning.   Patient not currently using contraception, is not actively trying to become pregnant at this time.  Patient counseled on contraception.  Health maintenance: Patient's chart flagging she is due for DTaP.  Patient last had Tdap in October 2020.  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  We will request that this flag be removed from patient's chart.   PERTINENT  PMH / PSH:  Patient Active Problem List   Diagnosis Date Noted  . Need for diphtheria-tetanus-pertussis (Tdap) vaccine 06/04/2019  . Screen for STD (sexually transmitted disease) 11/28/2018  . Bacterial vaginosis 11/28/2018  . Vaginal yeast infection 06/24/2018  . Vaginal itching 02/05/2012  . Contraception management 01/23/2011    OBJECTIVE:   BP 122/80   Pulse 89   Ht 5' (1.524 m)   Wt 156 lb 3.2 oz (70.9 kg)   SpO2 98%   BMI 30.51 kg/m    Physical Exam:  General: Well appearing, pleasant patient, nontoxic appearing Respiratory: Speaking complete sentences Cardiac: Regular rate rhythm, no murmurs appreciated GU: Erythema appreciated to medial labia majora and minora, white chunky discharge appreciated on physical exam, cervix normal-appearing, scant blood present appropriate considering patient's period just ended   ASSESSMENT/PLAN:   Vaginal yeast infection Patient's wet prep today consistent with yeast, positive KOH.  Urine pregnancy test today negative, patient just ended her  cycle. -Diflucan 150 mg tablet for yeast infection  Screen for STD (sexually transmitted disease) Patient would like to be screened today for HIV, syphilis, gonorrhea, chlamydia, trichomonas. -We will call patient with results and treat as needed  Need for diphtheria-tetanus-pertussis (Tdap) vaccine Patient's care gaps in epic showing patient is due for DTaP vaccine.  Patient skipped a DTaP vaccine as a child, is currently up-to-date on tetanus vaccine. -We will contact epic administration to have flag removed.     Dollene Cleveland, DO [facility] [facility]

## 2019-06-04 NOTE — Assessment & Plan Note (Signed)
Patient would like to be screened today for HIV, syphilis, gonorrhea, chlamydia, trichomonas. -We will call patient with results and treat as needed

## 2019-06-05 LAB — HIV ANTIBODY (ROUTINE TESTING W REFLEX): HIV Screen 4th Generation wRfx: NONREACTIVE

## 2019-06-05 LAB — CERVICOVAGINAL ANCILLARY ONLY
Chlamydia: NEGATIVE
Comment: NEGATIVE
Comment: NORMAL
Neisseria Gonorrhea: NEGATIVE

## 2019-06-05 LAB — RPR: RPR Ser Ql: NONREACTIVE

## 2019-06-10 ENCOUNTER — Encounter: Payer: Self-pay | Admitting: Family Medicine

## 2019-06-16 ENCOUNTER — Telehealth: Payer: Self-pay

## 2019-06-16 DIAGNOSIS — B373 Candidiasis of vulva and vagina: Secondary | ICD-10-CM

## 2019-06-16 DIAGNOSIS — B3731 Acute candidiasis of vulva and vagina: Secondary | ICD-10-CM

## 2019-06-16 NOTE — Telephone Encounter (Signed)
Patient calls nurse line requesting refill on diflucan. Patient states that yeast infection has still not cleared up. Please advise next steps for patient.   If patient is to receive medications, please send to [facility] on [address].   To PCP and Dr. Dareen Piano (Last OV on 06/04/19)  Veronda Prude, RN

## 2019-06-17 MED ORDER — FLUCONAZOLE 150 MG PO TABS: ORAL_TABLET | ORAL | 0 refills | Status: DC

## 2019-06-17 NOTE — Telephone Encounter (Signed)
Informed patient of RX.  Patient appreciative and verbalized understanding.  Glennie Hawk, CMA

## 2019-06-17 NOTE — Telephone Encounter (Signed)
Please let her know I sent in refill If not better after she takes she should come in for a visit  Thanks  LC

## 2019-07-15 ENCOUNTER — Ambulatory Visit (INDEPENDENT_AMBULATORY_CARE_PROVIDER_SITE_OTHER): Payer: Medicaid Other | Admitting: Family Medicine

## 2019-07-15 ENCOUNTER — Other Ambulatory Visit: Payer: Self-pay

## 2019-07-15 ENCOUNTER — Other Ambulatory Visit (HOSPITAL_COMMUNITY)
Admission: RE | Admit: 2019-07-15 | Discharge: 2019-07-15 | Disposition: A | Discharge status: Home / Self Care | Payer: Medicaid Other | Source: Ambulatory Visit | Attending: Family Medicine | Admitting: Family Medicine

## 2019-07-15 ENCOUNTER — Ambulatory Visit: Payer: Medicaid Other | Admitting: Family Medicine

## 2019-07-15 VITALS — BP 120/80 | HR 76 | Wt 159.0 lb

## 2019-07-15 DIAGNOSIS — B373 Candidiasis of vulva and vagina: Secondary | ICD-10-CM

## 2019-07-15 DIAGNOSIS — N898 Other specified noninflammatory disorders of vagina: Secondary | ICD-10-CM | POA: Diagnosis present

## 2019-07-15 DIAGNOSIS — B3731 Acute candidiasis of vulva and vagina: Secondary | ICD-10-CM

## 2019-07-15 DIAGNOSIS — Z114 Encounter for screening for human immunodeficiency virus [HIV]: Secondary | ICD-10-CM | POA: Diagnosis not present

## 2019-07-15 DIAGNOSIS — Z113 Encounter for screening for infections with a predominantly sexual mode of transmission: Secondary | ICD-10-CM | POA: Diagnosis not present

## 2019-07-15 LAB — POCT WET PREP (WET MOUNT)
Clue Cells Wet Prep Whiff POC: NEGATIVE
Trichomonas Wet Prep HPF POC: ABSENT

## 2019-07-15 LAB — POCT URINE PREGNANCY: Preg Test, Ur: NEGATIVE

## 2019-07-15 MED ORDER — FLUCONAZOLE 150 MG PO TABS: ORAL_TABLET | ORAL | 0 refills | Status: DC

## 2019-07-15 NOTE — Progress Notes (Addendum)
    SUBJECTIVE:   CHIEF COMPLAINT / HPI:   VAGINAL DISCHARGE  Having vaginal discharge for 3 days. Discharge consistency: thick Discharge color: white Medications tried: none  Recent antibiotic use: no Sex in last month: Y Possible STD expsure:unknown, intermittent condom use,   Symptoms Fever: n Dysuria:n Vaginal bleeding: n Abdomen or Pelvic pain: n Back pain: n Genital sores or ulcers:n Rash: n Pain during sex: n Missed menstrual period: n  ROS see HPI  PERTINENT  PMH / PSH: Candidasis Vaginalis on 05/2019  OBJECTIVE:   BP 120/80   Pulse 76   Wt 159 lb (72.1 kg)   SpO2 99%   BMI 31.05 kg/m   Gen: NAD, resting comfortably Abd: nontender, nondistended GU: no vaginal lesions, white discharge, no CMT, bimanual without masses palpated  Chaperoned with Tawny Hopping  ASSESSMENT/PLAN:   Vaginal discharge White discharge. Wet mount showed yeast. STI work up ordered.  Upreg neg. Counseled on safe sex - Wet mount - Urine preg - GC/Chlamydia - HIV - RPR - UA -Diflucan     Garnette Gunner, MD [facility]

## 2019-07-15 NOTE — Assessment & Plan Note (Addendum)
White discharge. Wet mount showed yeast. STI work up ordered.  Upreg neg. Counseled on safe sex - Wet mount - Urine preg - GC/Chlamydia - HIV - RPR - UA -Diflucan

## 2019-07-16 LAB — RPR: RPR Ser Ql: NONREACTIVE

## 2019-07-16 LAB — CERVICOVAGINAL ANCILLARY ONLY
Chlamydia: NEGATIVE
Comment: NEGATIVE
Comment: NORMAL
Neisseria Gonorrhea: NEGATIVE

## 2019-07-16 LAB — HIV ANTIBODY (ROUTINE TESTING W REFLEX): HIV Screen 4th Generation wRfx: NONREACTIVE

## 2019-10-02 ENCOUNTER — Ambulatory Visit: Payer: Medicaid Other | Admitting: Family Medicine

## 2019-10-02 NOTE — Progress Notes (Deleted)
    SUBJECTIVE:   CHIEF COMPLAINT / HPI:   ***  PERTINENT  PMH / PSH: ***  OBJECTIVE:   There were no vitals taken for this visit.  ***  ASSESSMENT/PLAN:   No problem-specific Assessment & Plan notes found for this encounter.     Ardell Makarewicz N Eswin Worrell, DO [facility]  

## 2019-10-07 ENCOUNTER — Ambulatory Visit (INDEPENDENT_AMBULATORY_CARE_PROVIDER_SITE_OTHER): Payer: Medicaid Other | Admitting: Family Medicine

## 2019-10-07 ENCOUNTER — Other Ambulatory Visit: Payer: Self-pay

## 2019-10-07 ENCOUNTER — Encounter: Payer: Self-pay | Admitting: Family Medicine

## 2019-10-07 VITALS — BP 108/72 | HR 77 | Temp 98.8°F | Wt 164.0 lb

## 2019-10-07 DIAGNOSIS — Z30011 Encounter for initial prescription of contraceptive pills: Secondary | ICD-10-CM | POA: Diagnosis present

## 2019-10-07 LAB — POCT URINE PREGNANCY: Preg Test, Ur: NEGATIVE

## 2019-10-07 MED ORDER — ETONOGESTREL-ETHINYL ESTRADIOL 0.12-0.015 MG/24HR VA RING: VAGINAL_RING | VAGINAL | 12 refills | Status: DC

## 2019-10-07 NOTE — Progress Notes (Signed)
    SUBJECTIVE:   CHIEF COMPLAINT / HPI: Discuss birth control   Jill Rush is a 29 year old female presenting for contraceptive management.  She currently is sexually active with one monogamous female partner.  LMP 7/23 and a little lighter than usual (3-4 days), last sexually active on 7/20.  Intermittent barrier protection use.  She would like children in the future, however would like to wait at least another several months before trying again (previously had a miscarriage a few years ago).  She has used NuvaRing in the past with success and would like to do this again.  Aware of her other options, had headaches with OCPs before.  PERTINENT  PMH / PSH: History of BV, no history of previous DVT/PE, non-smoker  OBJECTIVE:   BP 108/72   Pulse 77   Temp 98.8 F (37.1 C) (Oral)   Wt 164 lb (74.4 kg)   LMP 09/18/2019 (Exact Date)   SpO2 98%   BMI 32.03 kg/m   General: Alert, NAD HEENT: NCAT Cardiac: RRR no m/g/r Lungs: Clear bilaterally, no increased WOB  Abdomen: soft, non-tender, non-distended Msk: Moves all extremities spontaneously  Ext: Warm, dry, no edema   ASSESSMENT/PLAN:   Contraception management Discussed possible contraception options, Rx'd NuvaRing as she is previously done well with this in the past. U preg negative in the office today, however given change in recent LMP recommended follow-up pregnancy test in 2-4 weeks to confirm.    Follow-up if any concerns with contraception or as needed.  Allayne Stack, DO [place] [facility]

## 2019-10-07 NOTE — Patient Instructions (Signed)
It was wonderful seeing you today.  I have sent in the NuvaRing to your pharmacy.  You can either start this immediately today or choose a day that we would like to have the transition time for placing in and out.  I also encourage you to continue using condoms.  It would be reasonable to check a urine pregnancy test in the next 2-4 weeks just to ensure.

## 2019-10-10 ENCOUNTER — Encounter: Payer: Self-pay | Admitting: Family Medicine

## 2019-10-10 NOTE — Assessment & Plan Note (Signed)
Discussed possible contraception options, Rx'd NuvaRing as she is previously done well with this in the past. U preg negative in the office today, however given change in recent LMP recommended follow-up pregnancy test in 2-4 weeks to confirm.

## 2019-12-03 ENCOUNTER — Ambulatory Visit (INDEPENDENT_AMBULATORY_CARE_PROVIDER_SITE_OTHER): Payer: Medicaid Other | Admitting: Family Medicine

## 2019-12-03 ENCOUNTER — Other Ambulatory Visit: Payer: Self-pay

## 2019-12-03 VITALS — BP 112/70 | HR 83 | Ht 60.0 in | Wt 166.2 lb

## 2019-12-03 DIAGNOSIS — Z23 Encounter for immunization: Secondary | ICD-10-CM | POA: Insufficient documentation

## 2019-12-03 DIAGNOSIS — Z7185 Encounter for immunization safety counseling: Secondary | ICD-10-CM | POA: Diagnosis present

## 2019-12-03 NOTE — Progress Notes (Signed)
    SUBJECTIVE:   CHIEF COMPLAINT / HPI:   COVID Vaccination Patient presents today for her first COVID vaccination She needs it for her work She reports that she has taken slow to get it because she has been worried about the fertility side effects No allergy concerns  PERTINENT  PMH / PSH: Noncontributory  OBJECTIVE:   BP 112/70   Pulse 83   Ht 5' (1.524 m)   Wt 166 lb 3.2 oz (75.4 kg)   LMP 11/12/2019 (Exact Date)   SpO2 100%   BMI 32.46 kg/m    Physical Exam:  General: 29 y.o. female in NAD Lungs: Breathing comfortably on room air Skin: warm and dry   ASSESSMENT/PLAN:   No problem-specific Assessment & Plan notes found for this encounter.     Unknown Jim, DO [facility]

## 2019-12-03 NOTE — Assessment & Plan Note (Signed)
Patient counseled on Covid vaccination and that there has been no link to fertility with the Covid vaccination.  Also advised that both ACOG and national organization for fertility recommend that all women are vaccinated, pregnant or not.  Advised that she can have normal vaccine reaction after the vaccination especially in the first 24 hours, advised that if she has worsening symptoms after 24 hours, to call as it is possible that she is contracted Covid before receiving the vaccination, but advised that COVID-19 is not caused by the vaccination.  Questions were answered.  Patient will come back for her second dose of scheduled.  She was monitored for 15 minutes and had no evidence of allergic reaction.

## 2019-12-03 NOTE — Patient Instructions (Signed)
Thank you for coming to see me today. It was a pleasure. Today we talked about:   Congratulations on getting her first COVID shot today.  We will schedule you for your next visit for a second.  If you have questions, there is great information on a [facility] website as well as the CDC.  It is likely that you will not feel well rested today and tomorrow.  Symptoms usually do not last more than 24 hours.  There is normal to feel tired, have body aches, have right arm soreness, fevers, chills.  If anything last more than 24 hours and continues to linger, please let us know.  If you have any questions or concerns, please do not hesitate to call the office at [phone].  Best,   Luis Abed, DO

## 2019-12-07 DIAGNOSIS — Z7185 Encounter for immunization safety counseling: Secondary | ICD-10-CM | POA: Insufficient documentation

## 2019-12-24 ENCOUNTER — Ambulatory Visit: Payer: Medicaid Other

## 2019-12-25 ENCOUNTER — Other Ambulatory Visit: Payer: Self-pay

## 2019-12-25 ENCOUNTER — Ambulatory Visit (INDEPENDENT_AMBULATORY_CARE_PROVIDER_SITE_OTHER): Payer: Medicaid Other

## 2019-12-25 DIAGNOSIS — Z23 Encounter for immunization: Secondary | ICD-10-CM | POA: Diagnosis not present

## 2019-12-25 NOTE — Progress Notes (Signed)
   Covid-19 Vaccination Clinic  Name:  RANITA STJULIEN    MRN: [id number] DOB: 01-Dec-1990  12/25/2019  Ms. Terrill was observed post Covid-19 immunization for 15 minutes without incident. She was provided with Vaccine Information Sheet and instruction to access the V-Safe system.   Ms. Kleckley was instructed to call 911 with any severe reactions post vaccine: Marland Kitchen Difficulty breathing  . Swelling of face and throat  . A fast heartbeat  . A bad rash all over body  . Dizziness and weakness   #2 Covid Vaccine administered LD without complication.

## 2020-04-21 IMAGING — US US OB COMP LESS 14 WK
1 series · 15 of 28 positions shown · non-contrast
Comparison: None.

CLINICAL DATA: Unknown LMP.  Evaluate dating and viability.

EXAM:
OBSTETRIC <14 WK US AND TRANSVAGINAL OB US
TECHNIQUE: Both transabdominal and transvaginal ultrasound examinations were
performed for complete evaluation of the gestation as well as the
maternal uterus, adnexal regions, and pelvic cul-de-sac.
Transvaginal technique was performed to assess early pregnancy.

[Series 1: us ob comp less 14 wk · 15 of 73 slices shown]
[im 1/73]
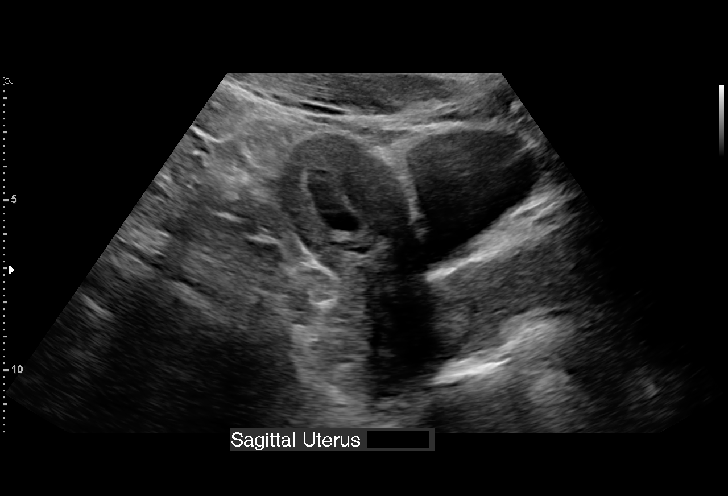
[im 6/73]
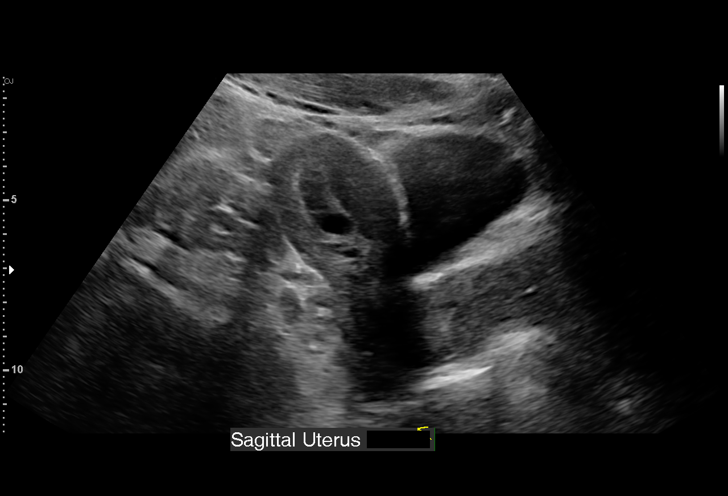
[im 11/73]
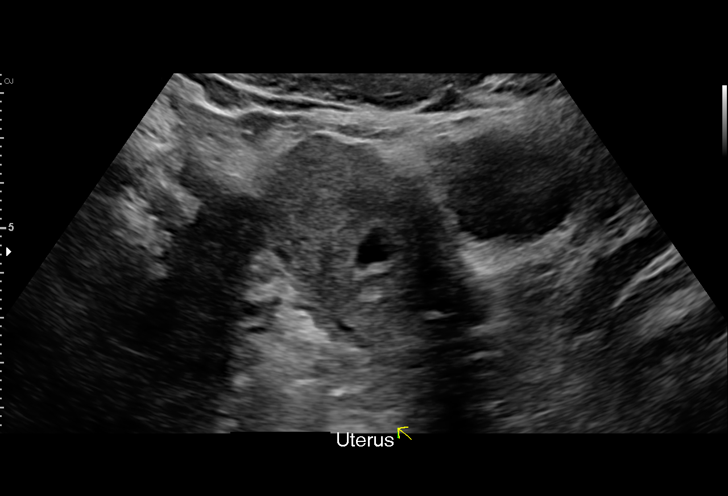
[im 17/73]
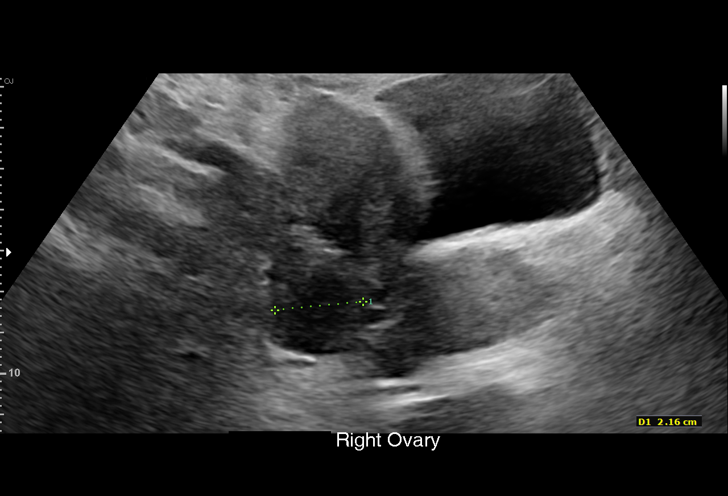
[im 22/73]
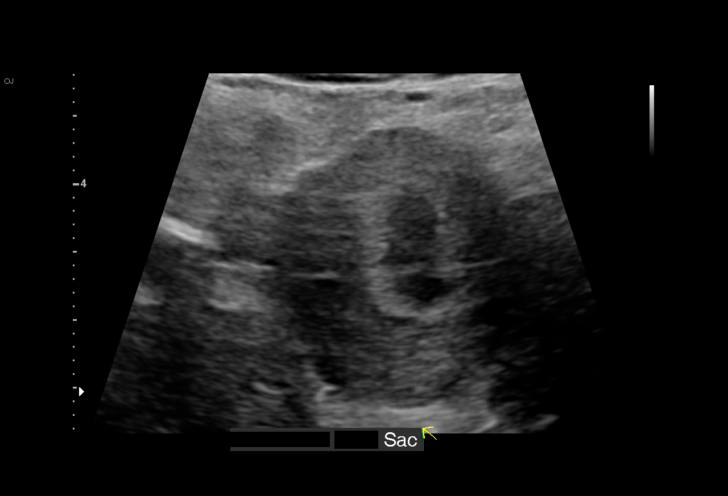
[im 27/73]
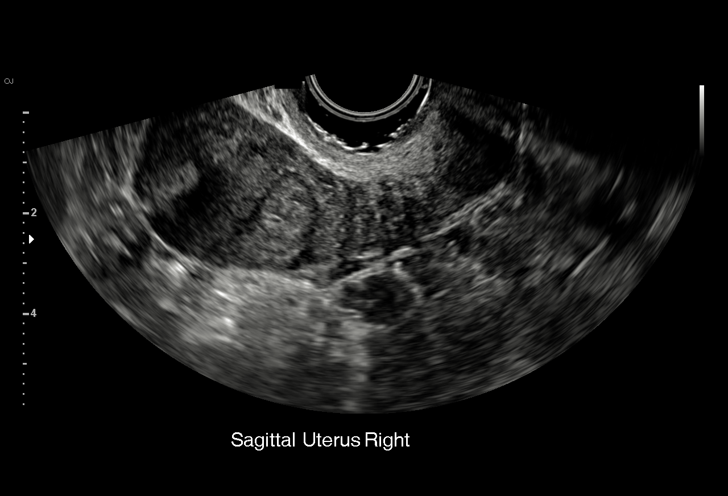
[im 33/73]
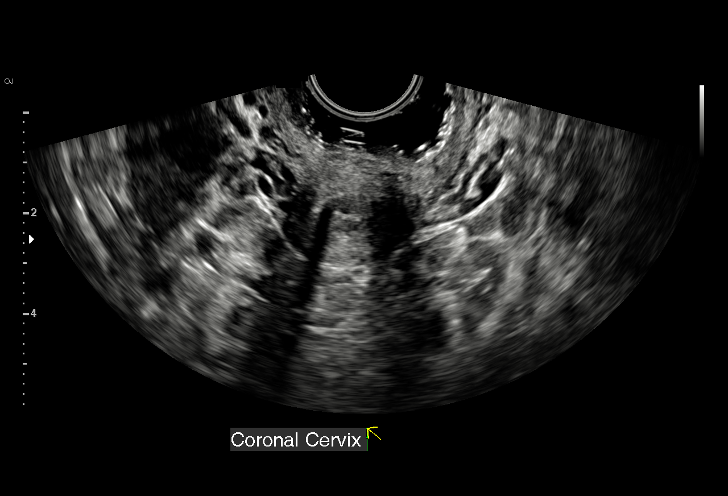
[im 38/73]
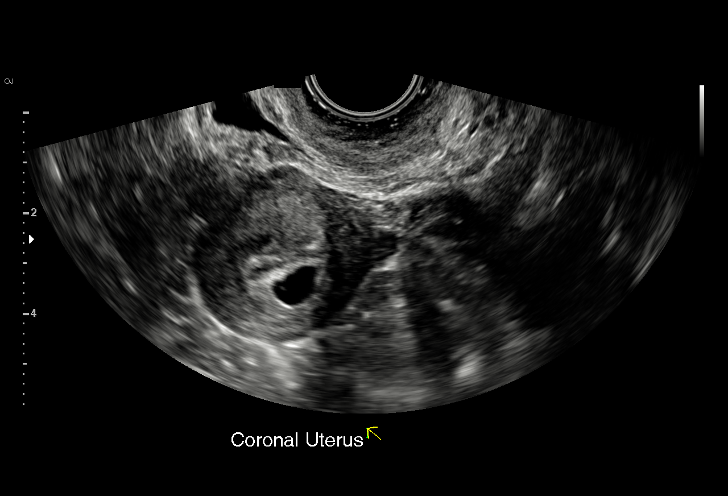
[im 41/73]
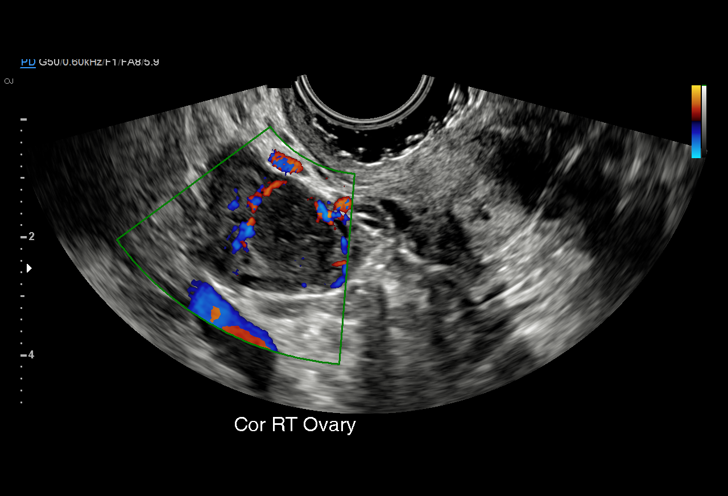
[im 46/73]
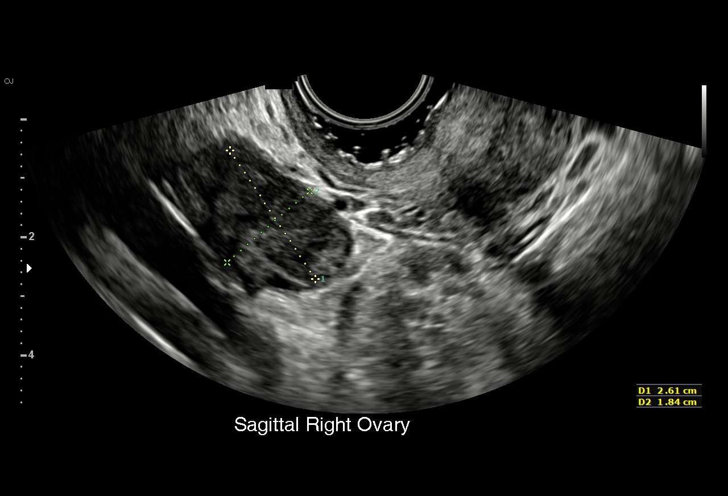
[im 51/73]
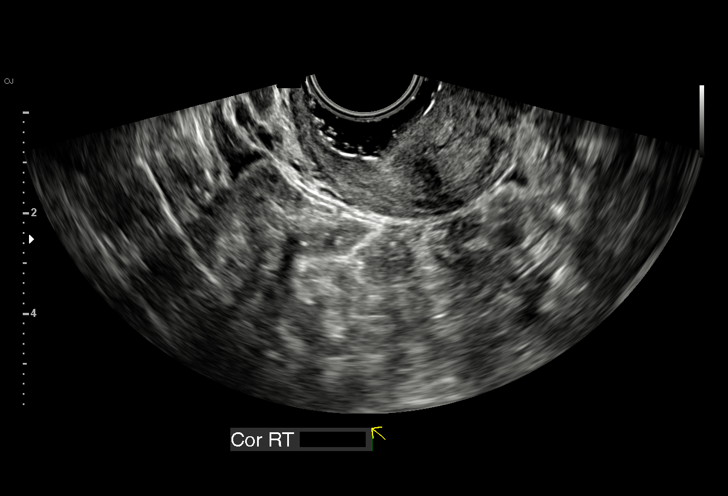
[im 57/73]
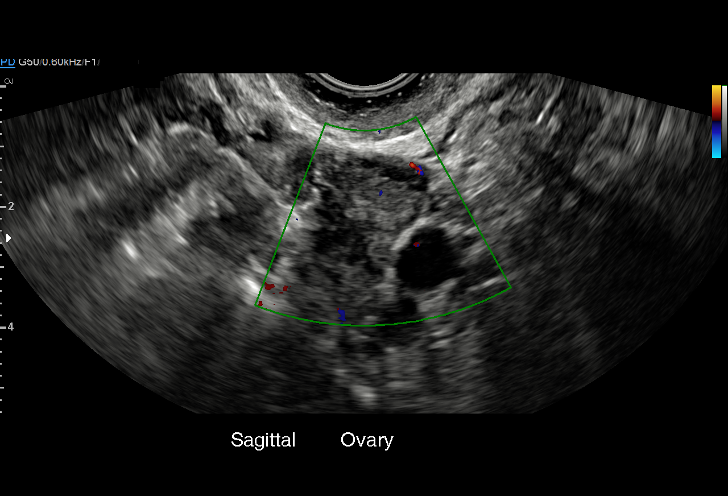
[im 62/73]
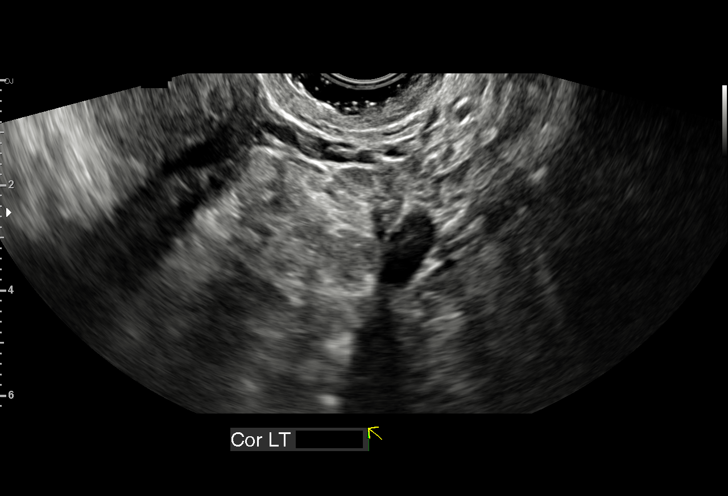
[im 67/73]
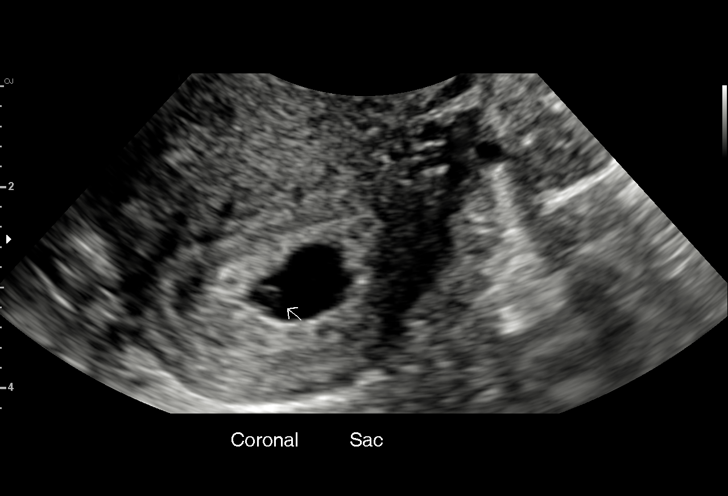
[im 73/73]
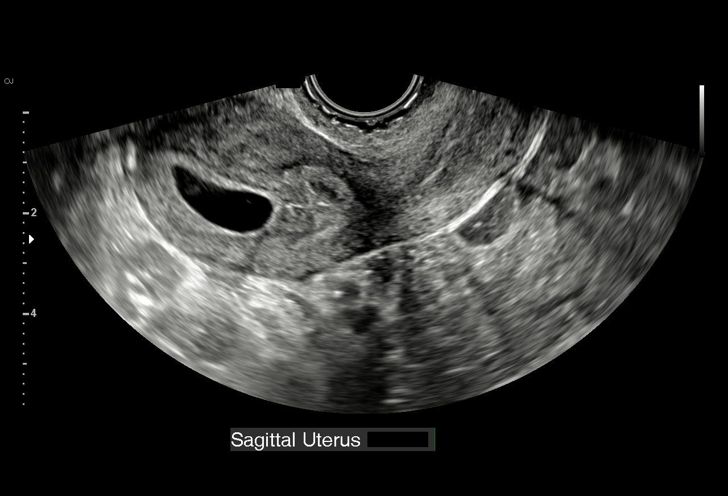

[15 of 28 positions shown; findings below may reference images not displayed]

FINDINGS: Intrauterine gestational sac: Single

Yolk sac:  Visualized.

Embryo:  Not Visualized.

MSD: 13 mm   6 w   0 d

Subchorionic hemorrhage:  None visualized.

Maternal uterus/adnexae: Small right ovarian corpus luteum noted.
Normal appearance of left ovary. No mass or abnormal free fluid
identified.
IMPRESSION: Single intrauterine gestational sac, with estimated gestational age
of 6 weeks 0 days by mean sac diameter. Consider following serial
b-hCG levels, with followup ultrasound to assess viability in 10
days.

No significant maternal uterine or adnexal abnormality identified.

## 2020-05-07 IMAGING — US US OB TRANSVAGINAL
1 series · 15 of 28 positions shown · non-contrast
Comparison: 06/24/2016

CLINICAL DATA: Unknown LMP. First trimester pregnancy with
inconclusive fetal viability.

EXAM:
TRANSVAGINAL OB ULTRASOUND
TECHNIQUE: Transvaginal ultrasound was performed for complete evaluation of the
gestation as well as the maternal uterus, adnexal regions, and
pelvic cul-de-sac.

[Series 1: us ob transvaginal · 15 of 34 slices shown]
[im 1/34]
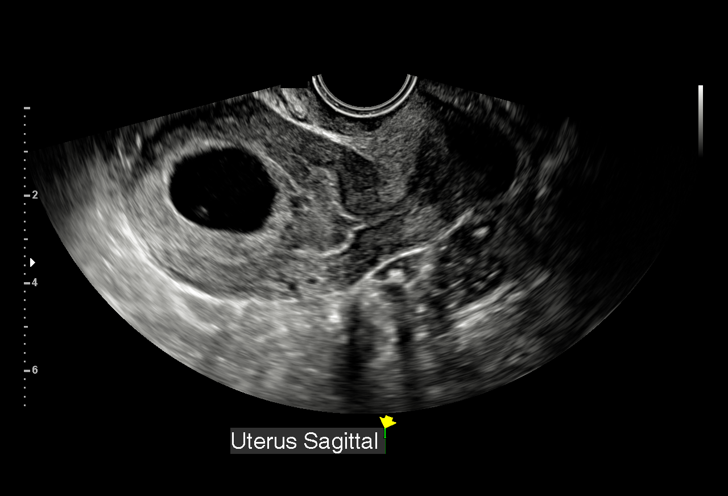
[im 3/34]
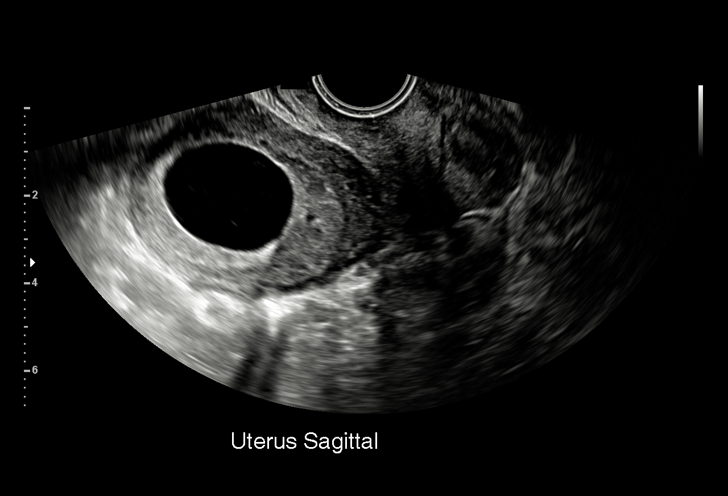
[im 5/34]
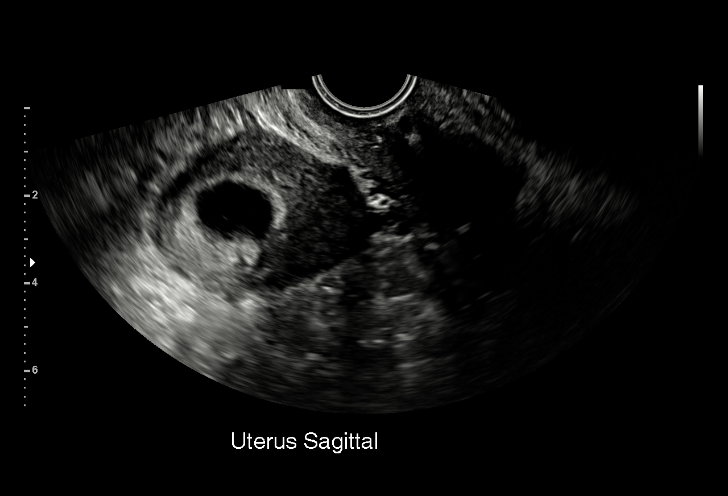
[im 8/34]
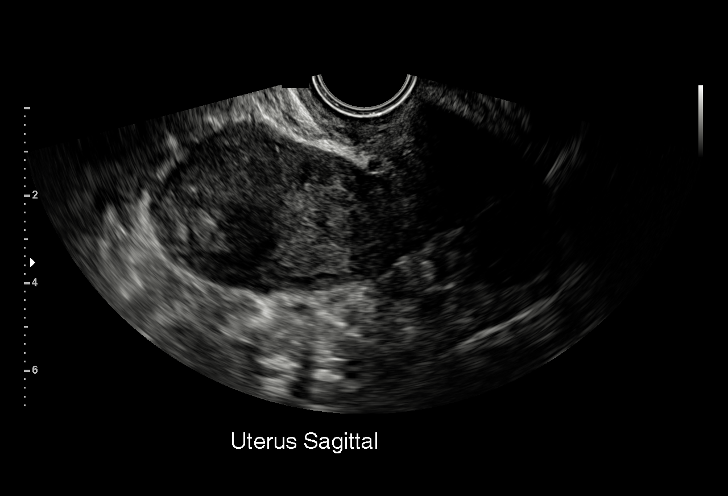
[im 10/34]
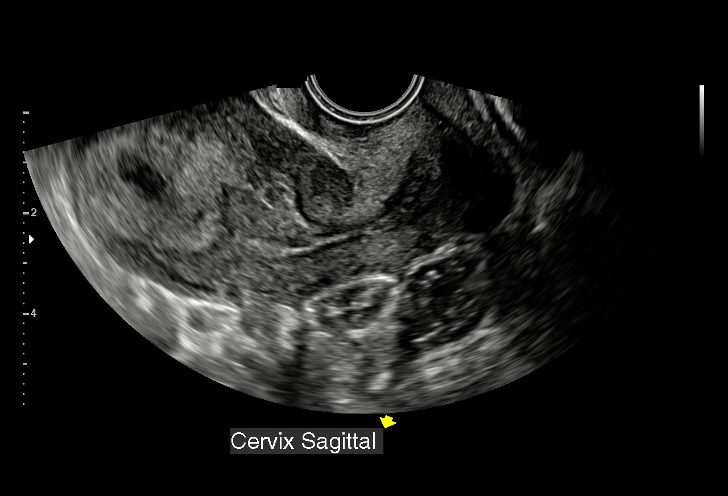
[im 13/34]
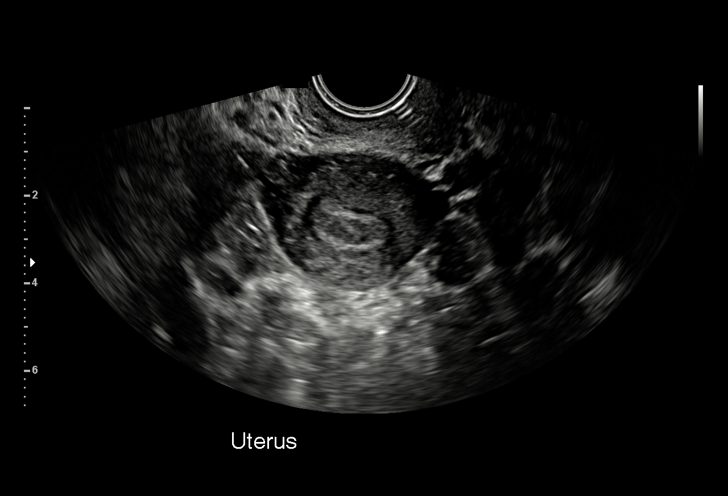
[im 15/34]
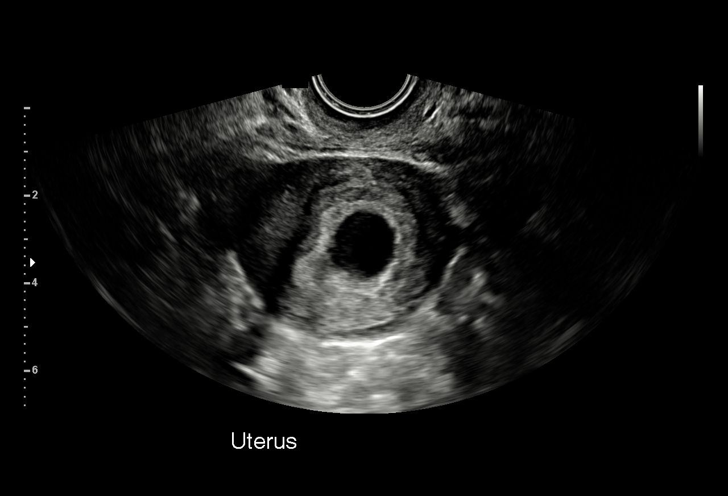
[im 18/34]
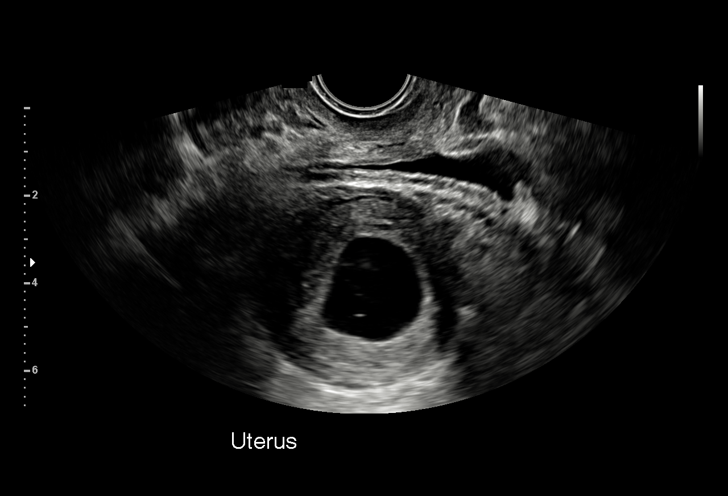
[im 19/34]
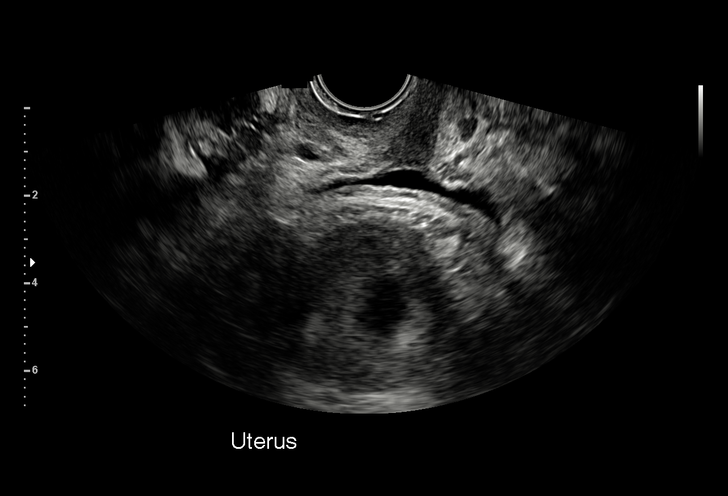
[im 21/34]
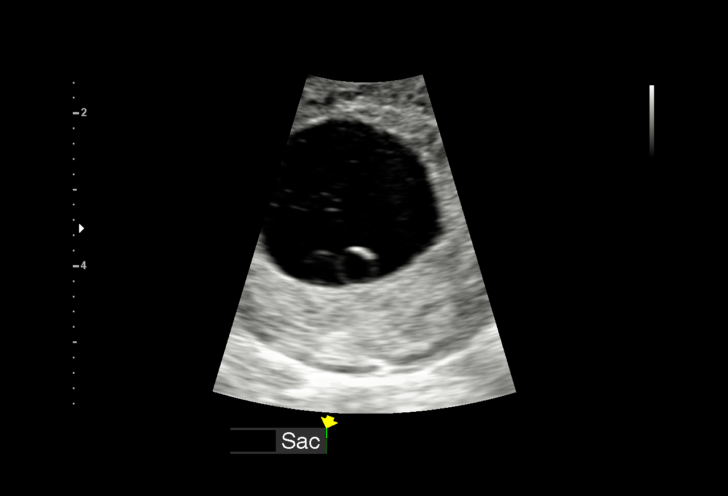
[im 24/34]
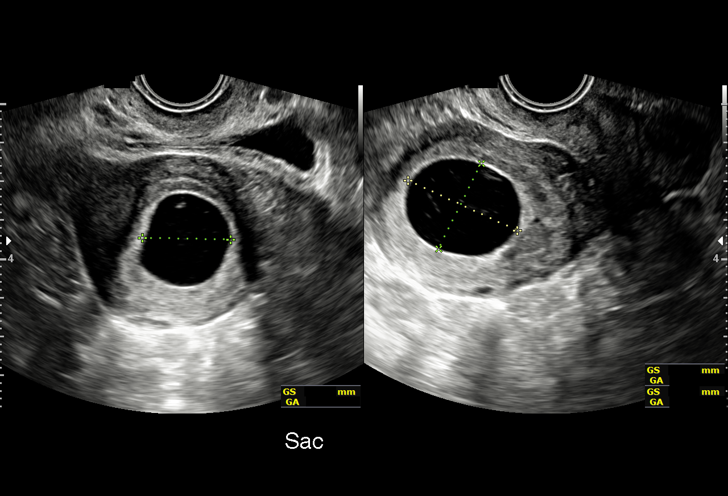
[im 26/34]
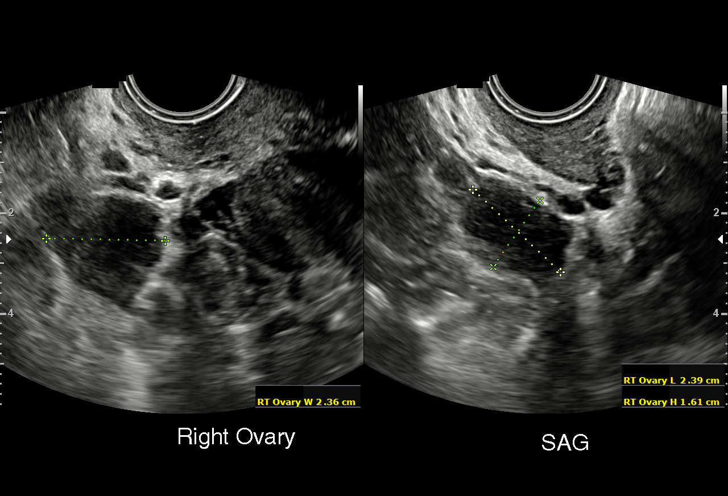
[im 29/34]
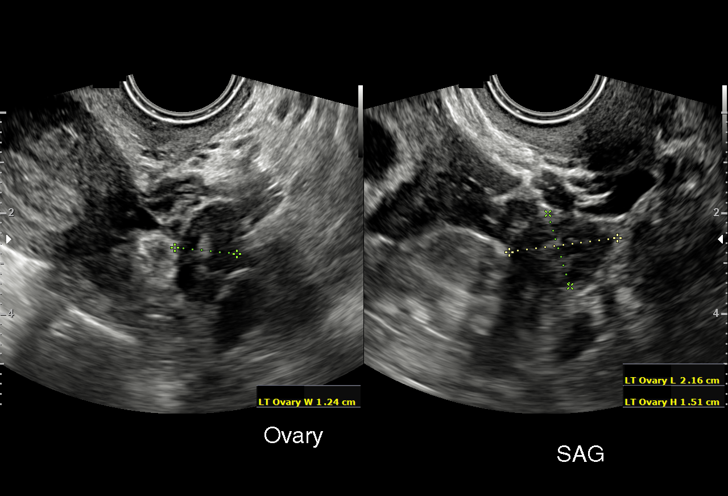
[im 31/34]
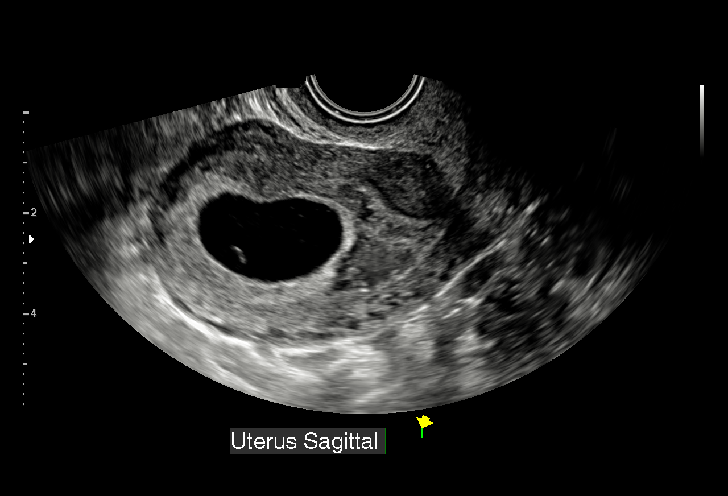
[im 34/34]
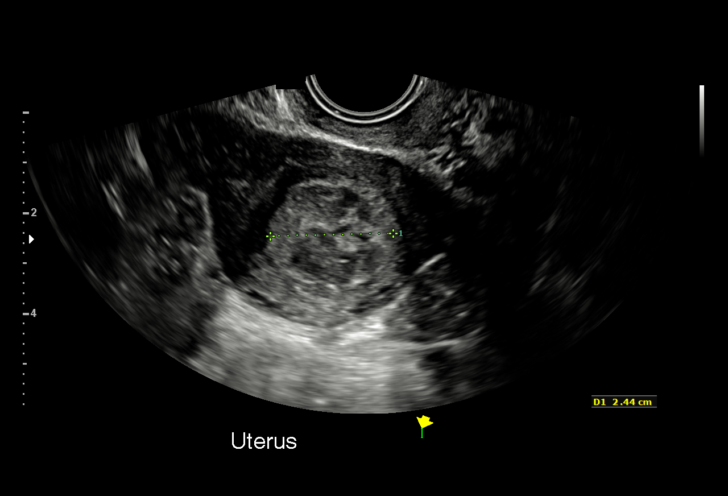

[15 of 28 positions shown; findings below may reference images not displayed]

FINDINGS: Intrauterine gestational sac: Single

Yolk sac:  Visualized.

Embryo:  Not Visualized.

MSD: 26 mm   7 w   4 d

Subchorionic hemorrhage:  None visualized.

Maternal uterus/adnexae: Normal appearance of both ovaries. No mass
or abnormal free fluid identified.
IMPRESSION: Findings meet definitive criteria for failed IUP. This follows SRU
consensus guidelines: Diagnostic Criteria for Nonviable Pregnancy
Early in the First Trimester. N Engl J Med 0969;[DATE].

## 2020-05-11 ENCOUNTER — Other Ambulatory Visit: Payer: Self-pay

## 2020-05-11 ENCOUNTER — Ambulatory Visit (INDEPENDENT_AMBULATORY_CARE_PROVIDER_SITE_OTHER): Payer: Medicaid Other

## 2020-05-11 DIAGNOSIS — Z111 Encounter for screening for respiratory tuberculosis: Secondary | ICD-10-CM

## 2020-05-11 NOTE — Progress Notes (Signed)
Patient is here for a PPD placement.  PPD placed in left forearm @ 0915 am.  Patient will return 05/13/2020 to have PPD read. Veronda Prude, RN

## 2020-05-12 NOTE — Telephone Encounter (Signed)
Error

## 2020-05-13 ENCOUNTER — Ambulatory Visit (INDEPENDENT_AMBULATORY_CARE_PROVIDER_SITE_OTHER): Payer: Medicaid Other

## 2020-05-13 ENCOUNTER — Other Ambulatory Visit: Payer: Self-pay

## 2020-05-13 DIAGNOSIS — Z111 Encounter for screening for respiratory tuberculosis: Secondary | ICD-10-CM

## 2020-05-13 LAB — TB SKIN TEST
Induration: 0 mm
TB Skin Test: NEGATIVE

## 2020-05-13 NOTE — Progress Notes (Signed)
Patient is here for a PPD read.  It was placed on 03//16/2022 in the left forearm @ 0915 am.    PPD RESULTS:  Result: negative Induration: 0 mm  Letter created and given to patient for documentation purposes. Veronda Prude, RN

## 2020-05-24 ENCOUNTER — Ambulatory Visit (INDEPENDENT_AMBULATORY_CARE_PROVIDER_SITE_OTHER): Payer: Medicaid Other | Admitting: Family Medicine

## 2020-05-24 ENCOUNTER — Encounter: Payer: Self-pay | Admitting: Family Medicine

## 2020-05-24 ENCOUNTER — Other Ambulatory Visit: Payer: Self-pay

## 2020-05-24 ENCOUNTER — Other Ambulatory Visit (HOSPITAL_COMMUNITY)
Admission: RE | Admit: 2020-05-24 | Discharge: 2020-05-24 | Disposition: A | Discharge status: Home / Self Care | Payer: Medicaid Other | Source: Ambulatory Visit | Attending: Family Medicine | Admitting: Family Medicine

## 2020-05-24 VITALS — BP 122/89 | HR 88 | Wt 182.8 lb

## 2020-05-24 DIAGNOSIS — Z124 Encounter for screening for malignant neoplasm of cervix: Secondary | ICD-10-CM | POA: Diagnosis present

## 2020-05-24 DIAGNOSIS — Z1159 Encounter for screening for other viral diseases: Secondary | ICD-10-CM

## 2020-05-24 DIAGNOSIS — Z13220 Encounter for screening for lipoid disorders: Secondary | ICD-10-CM

## 2020-05-24 NOTE — Patient Instructions (Addendum)
Good to see you today - Thank you for coming in  Things we discussed today:  Keeping you healthy: You should exercise at least 20 minutes every day.    Choose something you like the most or hate the least.   Having a set time every day and having a partner will help you stick to it.    If you are too tired try to do at least 5 minutes, it often gets easier.  To Improve your Weight Drink water and other unsweetened drinks like coffee, milk, tea- No sweet drinks Eat smaller portions of starchy and sweet foods like rice, potatoes, wheat, corn, or fruits.  Eat twice as many vegetables  Exercise at least 30 minutes every day If you wish to discuss weight loss further, please let me know or make an appointment   We will send you the results of your pap smear  Check with your job if they need a second PPD after your had the blood test  Call the day before your want to come in for a fasting blood test.  I will send you the results in my chart

## 2020-05-24 NOTE — Progress Notes (Signed)
    SUBJECTIVE:   CHIEF COMPLAINT / HPI:   Feels well here for physical and to fill our employment form.  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  No murmurs, gallops or rubs.    Lungs:  Normal respiratory effort, chest expands symmetrically. Lungs are clear to auscultation, no crackles or wheezes. Genitalia:  Normal introitus for age, no external lesions, thin whitish vaginal discharge, mucosa pink and moist, no vaginal or cervical lesions, no vaginal atrophy, no friaility or hemorrhage,    ASSESSMENT/PLAN:   Annual Examination Female  under 5 yo  I reviewed the following patient responses on our Physical Exam Form Tobacco use  Alcohol Use  Weight  Exercise  Risk for STI  Increased family cancer risk Violence risk  PHQ9 score reviewed  Blood pressure reviewed  I considered the following items based upon USPSTF recommendations: HIV testing:  Hepatitis C testing Cholesterol screening STI screening if high risk (Hepatitis B, Syphilis, Gonorrhea, Chlamydia) Cervical Cancer if female at birth Immunizations - Influenza, Covid, Shingle, Pneumonia, Tetanus  See After Visit Summary for recommendations  No problem-specific Assessment & Plan notes found for this encounter.     Carney Living, MD [facility]

## 2020-05-25 LAB — CYTOLOGY - PAP
Adequacy: ABSENT
Chlamydia: NEGATIVE
Comment: NEGATIVE
Comment: NORMAL
Diagnosis: NEGATIVE
Neisseria Gonorrhea: NEGATIVE

## 2020-07-08 ENCOUNTER — Ambulatory Visit: Payer: Medicaid Other

## 2020-07-11 ENCOUNTER — Encounter: Payer: Self-pay | Admitting: Family Medicine

## 2020-07-11 ENCOUNTER — Ambulatory Visit (INDEPENDENT_AMBULATORY_CARE_PROVIDER_SITE_OTHER): Payer: Medicaid Other | Admitting: Family Medicine

## 2020-07-11 ENCOUNTER — Other Ambulatory Visit: Payer: Self-pay

## 2020-07-11 VITALS — BP 114/68 | HR 85 | Wt 180.0 lb

## 2020-07-11 DIAGNOSIS — J302 Other seasonal allergic rhinitis: Secondary | ICD-10-CM

## 2020-07-11 MED ORDER — LORATADINE 10 MG PO TABS: 10.0000 mg | ORAL_TABLET | Freq: Every day | ORAL | 5 refills | Status: DC

## 2020-07-11 MED ORDER — FLUTICASONE PROPIONATE 50 MCG/ACT NA SUSP: 2.0000 | Freq: Every day | NASAL | 1 refills | Status: DC

## 2020-07-11 NOTE — Patient Instructions (Signed)
It was great to meet you!  For your allergies: - Start taking an allergy medication daily (Loratidine, Cetirizine, Fexofenadine aka Claritin, Zyrtec or Allegra). Choose whichever one works best for you. - You can use Flonase nasal spray daily - I also recommend [place] eye drops daily as needed for itchy eyes   Take care and seek immediate care sooner if you develop any concerns.  Dr. [facility]

## 2020-07-11 NOTE — Progress Notes (Signed)
    SUBJECTIVE:   CHIEF COMPLAINT / HPI:   Seasonal Allergies Patient reports itchy throat, itchy eyes, itchy/stuffy nose, and frequent sneezing. Thinks her symptoms have been present over the last 4 months or so. They were intermittent initially, but now they occur daily. Tend to be worse in the morning and at night. Of note, symptoms got worse after vacation at the beach. She has taken Benadryl on a few occassions when her symptoms were really bad and she couldn't sleep. Otherwise has not tried anything for relief. No history of allergies prior to ~2 years ago. Has always lived in this area.  No fever, cough, or other symptoms. No history of atopic dermatitis or asthma.  PERTINENT  PMH / PSH: None  OBJECTIVE:   BP 114/68   Pulse 85   Wt 180 lb (81.6 kg)   LMP 06/20/2020 (Exact Date)   SpO2 99%   Breastfeeding No   BMI 35.15 kg/m   Gen: alert, well-appearing, NAD Eyes: PERRL, slight watering of eyes and very mild erythema of bilateral conjunctiva Nose: moderate bogginess of nasal turbinates, turbinate mucosa slightly pale in color Throat: oropharynx clear without erythema, edema, or exudate Nodes: no cervical lymphadenopathy CV: RRR, normal S1/S2 without m/r/g Resp: normal WOB on room air, lungs CTAB  ASSESSMENT/PLAN:   Allergic Rhinitis Patient's symptoms and exam consistent with allergic rhinitis. Unclear trigger although likely environmental/seasonal with pollen etc. Patient has not tried anything for relief.  -Recommended daily antihistamine (Claritin, Zyrtec, Allegra depending on patient preference) -Flonase spray daily -OTC eye drops prn for itchiness -Return if symptoms worsen or fail to improve   Maury Dus, MD [facility]

## 2020-09-07 ENCOUNTER — Other Ambulatory Visit: Payer: Self-pay

## 2020-09-07 ENCOUNTER — Ambulatory Visit (INDEPENDENT_AMBULATORY_CARE_PROVIDER_SITE_OTHER): Payer: Medicaid Other | Admitting: Family Medicine

## 2020-09-07 DIAGNOSIS — A6000 Herpesviral infection of urogenital system, unspecified: Secondary | ICD-10-CM | POA: Diagnosis not present

## 2020-09-07 MED ORDER — VALACYCLOVIR HCL 500 MG PO TABS: 500.0000 mg | ORAL_TABLET | Freq: Two times a day (BID) | ORAL | 0 refills | Status: DC

## 2020-09-07 NOTE — Progress Notes (Signed)
    SUBJECTIVE:   CHIEF COMPLAINT / HPI:   HSV outbreak  Patient reports that she has history of genital herpes but has not had not had an outbreak in years.  She went to the pharmacy to get a prescription for this but was told it is been too long for her to just fill the prescription she needed to have a doctor's appointment.  Patient reports that her first outbreak was "really bad" but recurrences have been few and far between and have not been severe.  She would like a prescription to help speed up the process of recovery.  Outbreak started 2-3 days ago.  OBJECTIVE:   BP 128/72   Pulse 94   Wt 184 lb 6.4 oz (83.6 kg)   SpO2 98%   BMI 36.01 kg/m   General: Well-appearing 30 year old female, no acute distress Cardiac: Regular rate and rhythm, no murmurs appreciated Respiratory: Normal work of breathing, lungs clear to auscultation bilaterally GU: Deferred at this time, patient reports her symptoms are extremely consistent with previous outbreaks  ASSESSMENT/PLAN:   Herpes genitalia She reports herpes outbreak of labia majora.  Consistent with previous outbreaks.  Treating with valacyclovir 500 mg twice daily for 3 days.   Derrel Nip, MD [facility]

## 2020-09-07 NOTE — Assessment & Plan Note (Signed)
She reports herpes outbreak of labia majora.  Consistent with previous outbreaks.  Treating with valacyclovir 500 mg twice daily for 3 days.

## 2020-09-07 NOTE — Patient Instructions (Signed)
It was great seeing you today.  I have sent a prescription for Valtrex to your pharmacy.  You will take this twice daily for 3 days.  If you have any questions or concerns please feel free to call the clinic.  I hope you have a wonderful afternoon!

## 2021-07-06 ENCOUNTER — Ambulatory Visit (INDEPENDENT_AMBULATORY_CARE_PROVIDER_SITE_OTHER): Payer: Medicaid Other | Admitting: Family Medicine

## 2021-07-06 VITALS — BP 113/80 | HR 88 | Ht 60.0 in | Wt 181.2 lb

## 2021-07-06 DIAGNOSIS — J302 Other seasonal allergic rhinitis: Secondary | ICD-10-CM

## 2021-07-06 DIAGNOSIS — R0789 Other chest pain: Secondary | ICD-10-CM | POA: Diagnosis present

## 2021-07-06 MED ORDER — LORATADINE 10 MG PO TABS: 10.0000 mg | ORAL_TABLET | Freq: Every day | ORAL | 5 refills | Status: DC

## 2021-07-06 NOTE — Patient Instructions (Signed)
It was wonderful to see you today. ? ?Today we talked about: ? ?Chest pressure with deep breaths.  I suspect this is costochondritis.  I have provided some reading on this below.  In the meantime you can try a few days of ibuprofen or Aleve if you continue to have discomfort.  Please let us know if you develop any other symptoms.  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  And rest when you can. ? ?For seasonal allergies I have prescribed Claritin.  Please use Flonase as we discussed. ? ?Please call the clinic at [phone] if your symptoms worsen or you have any concerns. It was our pleasure to serve you. ? ?Dr. Salvadore Dom ? ?Costochondritis ? ?Costochondritis is irritation and swelling (inflammation) of the tissue that connects the ribs to the breastbone (sternum). This tissue is called cartilage. ?Costochondritis causes pain in the front of the chest. Usually, the pain: ?Starts slowly. ?Is in more than one rib. ?What are the causes? ?The exact cause of this condition is not always known. It results from stress on the tissue in the affected area. The cause of this stress could be: ?Chest injury. ?Exercise or activity, such as lifting. ?Very bad coughing. ?What increases the risk? ?You are more likely to develop this condition if you: ?Are female. ?Are 2-39 years old. ?Recently started a new exercise or work activity. ?Have low levels of vitamin D. ?Have a condition that makes you cough often. ?What are the signs or symptoms? ?The main symptom of this condition is chest pain. The pain: ?Usually starts slowly and can be sharp or dull. ?Gets worse with deep breathing, coughing, or exercise. ?Gets better with rest. ?May be worse when you press on the affected area of your ribs and breastbone. ?How is this treated? ?This condition usually goes away on its own over time. Your doctor may prescribe an NSAID, such as ibuprofen. This can help reduce pain and inflammation. Treatment may also include: ?Resting  and avoiding activities that make pain worse. ?Putting heat or ice on the painful area. ?Doing exercises to stretch your chest muscles. ?If these treatments do not help, your doctor may inject a numbing medicine to help relieve the pain. ?Follow these instructions at home: ?Managing pain, stiffness, and swelling ? ?  ? ?If told, put ice on the painful area. To do this: ?Put ice in a plastic bag. ?Place a towel between your skin and the bag. ?Leave the ice on for 20 minutes, 2-3 times a day. ?If told, put heat on the affected area. Do this as often as told by your doctor. Use the heat source that your doctor recommends, such as a moist heat pack or a heating pad. ?Place a towel between your skin and the heat source. ?Leave the heat on for 20-30 minutes. ?Take off the heat if your skin turns bright red. This is very important if you cannot feel pain, heat, or cold. You may have a greater risk of getting burned. ?Activity ?Rest as told by your doctor. ?Do not do anything that makes your pain worse. This includes any activities that use chest, belly (abdomen), and side muscles. ?Do not lift anything that is heavier than 10 lb (4.5 kg), or the limit that you are told, until your doctor says that it is safe. ?Return to your normal activities as told by your doctor. Ask your doctor what activities are safe for you. ?General instructions ?Take over-the-counter and prescription medicines only as told by your doctor. ?  Keep all follow-up visits as told by your doctor. This is important. ?Contact a doctor if: ?You have chills or a fever. ?Your pain does not go away or it gets worse. ?You have a cough that does not go away. ?Get help right away if: ?You are short of breath. ?You have very bad chest pain that is not helped by medicines, heat, or ice. ?These symptoms may be an emergency. Do not wait to see if the symptoms will go away. Get medical help right away. Call your local emergency services (911 in the [place] Do not drive  yourself to the hospital. ?Summary ?Costochondritis is irritation and swelling (inflammation) of the tissue that connects the ribs to the breastbone (sternum). ?This condition causes pain in the front of the chest. ?Treatment may include medicines, rest, heat or ice, and exercises. ?This information is not intended to replace advice given to you by your health care provider. Make sure you discuss any questions you have with your health care provider. ?Document Revised: 12/26/2018 Document Reviewed: 12/26/2018 ?Elsevier Patient Education ? 2023 Elsevier Inc. ? ?

## 2021-07-06 NOTE — Progress Notes (Signed)
? ? ? ?  SUBJECTIVE:  ? ?CHIEF COMPLAINT / HPI:  ? ?Chest pressure ?3 to 4 days of centralized chest pressure associated with deep breathing.  Also occurs when lying flat.  Otherwise does not occur at any other time.  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  Denies chest pain, shortness of breath, nausea or vomiting. ? ?Seasonal allergies ?Associated with headache.  States that she was prescribed an allergy medicine that she has yet to pick up.  Otherwise she has been taking Allegra.  She has also been taking Flonase intermittently.  Denies fever, cough. ? ?PERTINENT  PMH / PSH: as above ? ?OBJECTIVE:  ? ?BP 113/80   Pulse 88   Ht 5' (1.524 m)   Wt 181 lb 3.2 oz (82.2 kg)   LMP 06/22/2021   SpO2 99%   BMI 35.39 kg/m?   ?Physical Exam ?Vitals reviewed.  ?Constitutional:   ?   General: She is not in acute distress. ?   Appearance: She is not ill-appearing, toxic-appearing or diaphoretic.  ?Cardiovascular:  ?   Heart sounds: Normal heart sounds. No murmur heard. ?No systolic murmur is present.  ?No diastolic murmur is present.  ?  No friction rub. No gallop.  ?Pulmonary:  ?   Effort: Pulmonary effort is normal.  ?   Breath sounds: Normal breath sounds. No decreased breath sounds, wheezing, rhonchi or rales.  ?Chest:  ?   Chest wall: No tenderness or crepitus.  ?Neurological:  ?   Mental Status: She is alert and oriented to person, place, and time.  ?Psychiatric:     ?   Mood and Affect: Mood normal. Mood is not anxious.     ?   Behavior: Behavior normal.  ? ? ?ASSESSMENT/PLAN:  ? ?1. Chest pressure ?Acute chest discomfort with taking deep breaths and lying flat. Asymptomatic currently.  No hx of recent febrile illness. Heart sounds are normal. Can consider pericarditis if symptoms worsen. At this time it is possible costochondritis given working as Lawyer. Discussed use of ibuprofen and good body mechanics as it pertains to lifting patients.  Strict return precautions given.  ? ?2. Seasonal allergies ?Not well controlled. Discussed proper use of flonase. Continue this and start the below.  ?- loratadine (CLARITIN) 10 MG tablet; Take 1 tablet (10 mg total) by mouth daily.  Dispense: 30 tablet; Refill: 5 ? ?  ? ? ?Jill Silvernail Autry-Lott, DO ?[facility]  ?

## 2021-07-20 ENCOUNTER — Ambulatory Visit (INDEPENDENT_AMBULATORY_CARE_PROVIDER_SITE_OTHER): Payer: Medicaid Other | Admitting: Family Medicine

## 2021-07-20 ENCOUNTER — Encounter: Payer: Self-pay | Admitting: Family Medicine

## 2021-07-20 VITALS — BP 112/77 | HR 82 | Ht 60.0 in | Wt 187.0 lb

## 2021-07-20 DIAGNOSIS — N898 Other specified noninflammatory disorders of vagina: Secondary | ICD-10-CM | POA: Diagnosis not present

## 2021-07-20 DIAGNOSIS — R300 Dysuria: Secondary | ICD-10-CM

## 2021-07-20 DIAGNOSIS — A6000 Herpesviral infection of urogenital system, unspecified: Secondary | ICD-10-CM

## 2021-07-20 LAB — POCT URINALYSIS DIP (MANUAL ENTRY)
Bilirubin, UA: NEGATIVE
Blood, UA: NEGATIVE
Glucose, UA: NEGATIVE mg/dL
Ketones, POC UA: NEGATIVE mg/dL
Leukocytes, UA: NEGATIVE
Nitrite, UA: NEGATIVE
Protein Ur, POC: NEGATIVE mg/dL
Spec Grav, UA: 1.02 (ref 1.010–1.025)
Urobilinogen, UA: 2 E.U./dL — AB
pH, UA: 7 (ref 5.0–8.0)

## 2021-07-20 MED ORDER — VALACYCLOVIR HCL 1 G PO TABS: 1000.0000 mg | ORAL_TABLET | Freq: Every day | ORAL | 1 refills | Status: AC

## 2021-07-20 NOTE — Patient Instructions (Signed)
Thank you for coming to see me today. It was a pleasure.   Take Valtrex 1 g daily for 3 days.  I have given you extra medication so you can use this if you have another outbreak.  I have sent off swabs and if comes back negative I will call you to stop the medication.  Please follow-up with PCP as needed  If you have any questions or concerns, please do not hesitate to call the office at [phone].  Best,   Dana Allan, MD

## 2021-07-20 NOTE — Progress Notes (Signed)
    SUBJECTIVE:    CHIEF COMPLAINT / HPI: vaginal pain  Reports pain on clitoral area.  Recent sexual activity 6 days ago, used condoms, no oral sex. Did use toy clitoral stimulator.  Pain when urine touches area or when sitting on specific area.  Denies any fevers, urinary frequency, dysuria, abdominal pain, vaginal discharge, bleeding or lesions or rash.  No new soaps, detergents, bubble baths, or lotions.  No douching.  LMP 04/27.  Not on any OCP.  Reports last outbreak of herpes greater than 10 years and had lesions at that time.    PERTINENT  PMH / PSH:  Genital herpes  OBJECTIVE:   BP 112/77   Pulse 82   Ht 5' (1.524 m)   Wt 187 lb (84.8 kg)   LMP 06/22/2021   SpO2 100%   BMI 36.52 kg/m    General: Alert, no acute distress Pelvic Exam chaperoned by [facility] April         External: normal female genitalia, small ulcerated area with erythematous base at the 1 o'clock area of the labia minora.  Extremely tender to touch.        Vagina: not examined secondary to pain        Cervix: not examined secondary to pain         ASSESSMENT/PLAN:   Herpes genitalia Symptoms consistent with herpes outbreak Urine negative for infection HSV swab Valacyclovir 1 gm daily x 3 days. Can repeat same dosing for future outbreaks. If having more recurrent outbreaks can discuss with PCP daily prophylaxis. If deciding to become pregnant will need to start prophylactic therapy at [redacted] weeks gestation. Recommend no sexual activity during outbreak, including oral Advise condom use with each sexual encounter thereafter Recommend discussion with PCP to initiate OCP if not desiring pregnancy currently Follow up with PCP if symptoms do not resolve.     Dana Allan, MD [facility]

## 2021-07-22 ENCOUNTER — Encounter: Payer: Self-pay | Admitting: Family Medicine

## 2021-07-22 NOTE — Assessment & Plan Note (Addendum)
Symptoms consistent with herpes outbreak Urine negative for infection HSV swab Valacyclovir 1 gm daily x 3 days. Can repeat same dosing for future outbreaks. If having more recurrent outbreaks can discuss with PCP daily prophylaxis. If deciding to become pregnant will need to start prophylactic therapy at [redacted] weeks gestation. Recommend no sexual activity during outbreak, including oral Advise condom use with each sexual encounter thereafter Recommend discussion with PCP to initiate OCP if not desiring pregnancy currently Follow up with PCP if symptoms do not resolve.

## 2021-07-25 ENCOUNTER — Encounter: Payer: Self-pay | Admitting: Family Medicine

## 2021-07-25 LAB — HSV DNA BY PCR (REFERENCE LAB)
HSV 2 DNA: POSITIVE — AB
HSV-1 DNA: NEGATIVE

## 2021-08-01 ENCOUNTER — Encounter: Payer: Self-pay | Admitting: *Deleted

## 2021-11-21 ENCOUNTER — Ambulatory Visit (INDEPENDENT_AMBULATORY_CARE_PROVIDER_SITE_OTHER): Payer: Medicaid Other | Admitting: Family Medicine

## 2021-11-21 ENCOUNTER — Other Ambulatory Visit: Payer: Self-pay

## 2021-11-21 ENCOUNTER — Encounter: Payer: Self-pay | Admitting: Family Medicine

## 2021-11-21 VITALS — BP 127/72 | HR 80 | Wt 181.0 lb

## 2021-11-21 DIAGNOSIS — Z23 Encounter for immunization: Secondary | ICD-10-CM

## 2021-11-21 DIAGNOSIS — R21 Rash and other nonspecific skin eruption: Secondary | ICD-10-CM | POA: Diagnosis not present

## 2021-11-21 MED ORDER — TRIAMCINOLONE ACETONIDE 0.1 % EX OINT: 1.0000 | TOPICAL_OINTMENT | Freq: Two times a day (BID) | CUTANEOUS | 0 refills | Status: DC

## 2021-11-21 NOTE — Patient Instructions (Signed)
Good to see you today - Thank you for coming in  Things we discussed today:  Rash Use the ointment twice a day  Should be much improved in 4-5 day Let me know if spreading or any fever or feeling bad  Weight Drink water and other unsweetened drinks like coffee, milk, tea- No sweet drinks Eat smaller portions of starchy and sweet foods like rice, potatoes, wheat, corn, or fruits.  Eat twice as many vegetables  Exercise at least 30 minutes every day If you wish to discuss weight loss further, please let me know or make an appointment   Exercise at least 20 minutes every day.    Choose something you like the most or hate the least.   Having a set time every day and having a partner will help you stick to it.    If you are too tired try to do at least 5 minutes, it often gets easier.   Come back in 2 years for a PAP smear

## 2021-11-21 NOTE — Progress Notes (Signed)
    SUBJECTIVE:   CHIEF COMPLAINT / HPI:   Rash On L flank.  Itchy.  Notice a few days ago.  ? Bug bite.   No fever or other rash or any vaginal or mouth symptoms.  Planning to start exercise and working on diet   Last physical March 2022  OBJECTIVE:   BP 127/72   Pulse 80   Wt 181 lb (82.1 kg)   LMP 11/20/2021   SpO2 100%   BMI 35.35 kg/m   Heart - Regular rate and rhythm.  No murmurs, gallops or rubs.    Lungs:  Normal respiratory effort, chest expands symmetrically. Lungs are clear to auscultation, no crackles or wheezes. Skin focal area of erytema and vesicles on L flank No other lesions Mouth - no lesions, mucous membranes are moist, no decaying teeth       ASSESSMENT/PLAN:   Rash Possible contact. Possible HSV but unusual location.  No signs of other vesicles along dermatome.  Treat with topical steroid and monitor for spread or systemic symptoms    Annual Examination Female  31 yo  I reviewed the following patient responses on our Physical Exam Form Tobacco use no Alcohol Use  Weight see after visit summary  Exercise - see after visit summary  Risk for STI  Increased family cancer risk Violence risk  PHQ9 score reviewed  Blood pressure reviewed  I considered the following items based upon USPSTF recommendations: HIV testing:  Hepatitis C testing Cholesterol screening STI screening if high risk (Hepatitis B, Syphilis, Gonorrhea, Chlamydia) Cervical Cancer if female at birth Immunizations - Influenza, Covid, Shingle, Pneumonia, Tetanus  See After Visit Summary for recommendations   Patient Instructions  Good to see you today - Thank you for coming in  Things we discussed today:  Rash Use the ointment twice a day  Should be much improved in 4-5 day Let me know if spreading or any fever or feeling bad  Weight Drink water and other unsweetened drinks like coffee, milk, tea- No sweet drinks Eat smaller portions of starchy and sweet foods  like rice, potatoes, wheat, corn, or fruits.  Eat twice as many vegetables  Exercise at least 30 minutes every day If you wish to discuss weight loss further, please let me know or make an appointment   Exercise at least 20 minutes every day.    Choose something you like the most or hate the least.   Having a set time every day and having a partner will help you stick to it.    If you are too tired try to do at least 5 minutes, it often gets easier.   Come back in 2 years for a PAP smear   Lind Covert, MD [place]

## 2021-11-21 NOTE — Assessment & Plan Note (Signed)
Possible contact. Possible HSV but unusual location.  No signs of other vesicles along dermatome.  Treat with topical steroid and monitor for spread or systemic symptoms

## 2022-10-15 ENCOUNTER — Ambulatory Visit: Payer: Medicaid Other

## 2022-10-17 ENCOUNTER — Ambulatory Visit: Payer: Self-pay | Admitting: Family Medicine

## 2022-10-18 ENCOUNTER — Ambulatory Visit: Payer: Self-pay | Admitting: Student

## 2022-10-18 NOTE — Progress Notes (Deleted)
  SUBJECTIVE:   CHIEF COMPLAINT / HPI:   STI check - recently became sexually active with a new partner, wants to be checked for STIs. Previously tested for ***.  - preferred gender of partner: *** - Medications tried: *** - Sexually active with *** *** partner(s) - Last sexual encounter: *** - Contraception: *** Symptoms include: {STISXs:28021}   PERTINENT  PMH / PSH: HSV  Patient Care Team: Carney Living, MD as PCP - General OBJECTIVE:  There were no vitals taken for this visit. Physical Exam   ASSESSMENT/PLAN:  There are no diagnoses linked to this encounter. No follow-ups on file. Alfredo Martinez, MD 10/18/2022, 8:03 AM PGY-3, [facility] {    This will disappear when note is signed, click to select method of visit    :1}

## 2022-12-20 DIAGNOSIS — Z113 Encounter for screening for infections with a predominantly sexual mode of transmission: Secondary | ICD-10-CM | POA: Diagnosis not present

## 2022-12-20 DIAGNOSIS — Z1151 Encounter for screening for human papillomavirus (HPV): Secondary | ICD-10-CM | POA: Diagnosis not present

## 2022-12-20 DIAGNOSIS — Z124 Encounter for screening for malignant neoplasm of cervix: Secondary | ICD-10-CM | POA: Diagnosis not present

## 2023-02-22 DIAGNOSIS — N760 Acute vaginitis: Secondary | ICD-10-CM | POA: Diagnosis not present

## 2023-02-22 DIAGNOSIS — Z113 Encounter for screening for infections with a predominantly sexual mode of transmission: Secondary | ICD-10-CM | POA: Diagnosis not present

## 2023-04-01 ENCOUNTER — Ambulatory Visit: Payer: Self-pay

## 2023-04-01 DIAGNOSIS — Z111 Encounter for screening for respiratory tuberculosis: Secondary | ICD-10-CM | POA: Diagnosis not present

## 2023-04-01 NOTE — Progress Notes (Cosign Needed Addendum)
Patient presents to nurse clinic for PPD placement. PPD placed in right ventral forearm. Patient to return on 2/5 to have site read.

## 2023-04-03 ENCOUNTER — Ambulatory Visit: Payer: 59

## 2023-04-03 DIAGNOSIS — Z111 Encounter for screening for respiratory tuberculosis: Secondary | ICD-10-CM

## 2023-04-03 LAB — TB SKIN TEST
Induration: 0 mm
TB Skin Test: NEGATIVE

## 2023-04-03 NOTE — Progress Notes (Signed)
PPD Reading Note PPD read and results entered in EpicCare. Result: 0 mm induration. Interpretation: Negative Allergic reaction: No  

## 2023-04-16 ENCOUNTER — Ambulatory Visit (INDEPENDENT_AMBULATORY_CARE_PROVIDER_SITE_OTHER): Payer: 59

## 2023-04-16 VITALS — BP 100/70 | HR 64 | Wt 178.0 lb

## 2023-04-16 DIAGNOSIS — Z3A01 Less than 8 weeks gestation of pregnancy: Secondary | ICD-10-CM

## 2023-04-16 DIAGNOSIS — Z3491 Encounter for supervision of normal pregnancy, unspecified, first trimester: Secondary | ICD-10-CM | POA: Diagnosis not present

## 2023-04-16 DIAGNOSIS — Z3201 Encounter for pregnancy test, result positive: Secondary | ICD-10-CM | POA: Diagnosis not present

## 2023-04-16 LAB — POCT URINE PREGNANCY: Preg Test, Ur: POSITIVE — AB

## 2023-04-16 NOTE — Progress Notes (Unsigned)
    SUBJECTIVE:   CHIEF COMPLAINT / HPI:   Positive home UPT, confirmed in office.  LMP 03/09/2023 and was normal, periods have occurred monthly and regular She feels happy about the pregnancy but also nervous d/t miscarriage 2019 at 8 weeks She is taking a prenatal vitamin, no other medications   She is feeling nauseous but not vomiting, breat tenderness and fatigue She is established at OB/GYN, unsure if she wants to follow with [facility] versus OB for her pregnancy.  Would like to get initial OB labs today.  PERTINENT  PMH / PSH: HSV-2  OBJECTIVE:   BP 100/70   Pulse 64   Wt 178 lb (80.7 kg)   LMP 03/09/2023   SpO2 98%   BMI 34.76 kg/m    General: NAD, pleasant, able to participate in exam Cardiac: RRR, no murmurs. Respiratory: CTAB, normal effort, No wheezes, rales or rhonchi Skin: warm and dry Neuro: alert, no obvious focal deficits Psych: Normal affect and mood  ASSESSMENT/PLAN:   Less than [redacted] weeks gestation of pregnancy Congratulated patient on pregnancy. Based on her period, she is ~ [redacted] weeks pregnant.  -Initial OB labs today -Continue prenatal vitamin -Will need HSV-2 prophylaxis and third trimester -Return to [facility] vs OB for initial OB visit in about 3 weeks -MAU precautions discussed     Dr. Erick Alley, DO [facility]

## 2023-04-16 NOTE — Patient Instructions (Signed)
  Congratulations on your pregnancy! We are checking some routine OB labs today. Please go to your initial OB visit as below.   Please make a follow up appointment in about 3 weeks with family medicine or OB   Prenatal Classes Go to [email or web address] for more information on the pregnancy and child birth classes that [facility] has to offer.   Pregnancy Related Return Precautions The follow are signs/symptoms that are abnormal in pregnancy and may require further evaluation by a physician: Go to the MAU at [facility] at [facility] if: You have cramping/contractions that do not go away with drinking water, especially if they are lasting 30 seconds to 1.5 minutes, coming and going every 5-10 minutes for an hour or more, or are getting stronger and you cannot walk or talk while having a contraction/cramp. Your water breaks.  Sometimes it is a big gush of fluid, sometimes it is just a trickle that keeps getting your underwear wet or running down your legs You have vaginal bleeding.    You do not feel your baby moving like normal.  If you do not, get something to eat and drink (something cold or something with sugar like peanut butter or juice) and lay down and focus on feeling your baby move. If your baby is still not moving like normal, you should go to MAU. You should feel your baby move 6 times in one hour, or 10 times in two hours. You have a persistent headache that does not go away with 1 g of Tylenol, vision changes, chest pain, difficulty breathing, severe pain in your right upper abdomen, worsening leg swelling- these can all be signs of high blood pressure in pregnancy and need to be evaluated by a provider immediately  These are all concerning in pregnancy and if you have any of these I recommend you call your PCP and present to the Maternity Admissions Unit (map below) for further evaluation.  For any pregnancy-related emergencies, please  go to the Maternity Admissions Unit in the [facility] at [facility]. You will use hospital Entrance C.    Our clinic number is [phone].   Dr Miquel Dunn

## 2023-04-17 DIAGNOSIS — Z3A01 Less than 8 weeks gestation of pregnancy: Secondary | ICD-10-CM | POA: Insufficient documentation

## 2023-04-17 NOTE — Assessment & Plan Note (Signed)
 Congratulated patient on pregnancy. Based on her period, she is ~ [redacted] weeks pregnant.  -Initial OB labs today -Continue prenatal vitamin -Will need HSV-2 prophylaxis and third trimester -Return to [facility] vs OB for initial OB visit in about 3 weeks -MAU precautions discussed

## 2023-04-18 LAB — AB SCR+ANTIBODY ID

## 2023-04-20 ENCOUNTER — Encounter (HOSPITAL_COMMUNITY): Payer: Self-pay

## 2023-04-20 ENCOUNTER — Inpatient Hospital Stay (HOSPITAL_COMMUNITY)
Admission: AD | Admit: 2023-04-20 | Discharge: 2023-04-20 | Disposition: A | Discharge status: Home / Self Care | Payer: 59 | Attending: Obstetrics and Gynecology | Admitting: Obstetrics and Gynecology

## 2023-04-20 ENCOUNTER — Inpatient Hospital Stay (HOSPITAL_COMMUNITY): Payer: 59

## 2023-04-20 ENCOUNTER — Other Ambulatory Visit: Payer: Self-pay

## 2023-04-20 DIAGNOSIS — O26859 Spotting complicating pregnancy, unspecified trimester: Secondary | ICD-10-CM | POA: Diagnosis not present

## 2023-04-20 DIAGNOSIS — O3680X0 Pregnancy with inconclusive fetal viability, not applicable or unspecified: Secondary | ICD-10-CM | POA: Diagnosis not present

## 2023-04-20 DIAGNOSIS — O26851 Spotting complicating pregnancy, first trimester: Secondary | ICD-10-CM | POA: Diagnosis present

## 2023-04-20 DIAGNOSIS — O209 Hemorrhage in early pregnancy, unspecified: Secondary | ICD-10-CM | POA: Diagnosis not present

## 2023-04-20 DIAGNOSIS — Z3A01 Less than 8 weeks gestation of pregnancy: Secondary | ICD-10-CM | POA: Diagnosis not present

## 2023-04-20 DIAGNOSIS — Z3A00 Weeks of gestation of pregnancy not specified: Secondary | ICD-10-CM | POA: Diagnosis not present

## 2023-04-20 LAB — CBC
HCT: 38.5 % (ref 36.0–46.0)
Hemoglobin: 13 g/dL (ref 12.0–15.0)
MCH: 30.7 pg (ref 26.0–34.0)
MCHC: 33.8 g/dL (ref 30.0–36.0)
MCV: 90.8 fL (ref 80.0–100.0)
Platelets: 328 10*3/uL (ref 150–400)
RBC: 4.24 MIL/uL (ref 3.87–5.11)
RDW: 13.2 % (ref 11.5–15.5)
WBC: 9.1 10*3/uL (ref 4.0–10.5)
nRBC: 0 % (ref 0.0–0.2)

## 2023-04-20 LAB — HCG, QUANTITATIVE, PREGNANCY: hCG, Beta Chain, Quant, S: 218 m[IU]/mL — ABNORMAL HIGH (ref <5)

## 2023-04-20 LAB — WET PREP, GENITAL
Sperm: NONE SEEN
Trich, Wet Prep: NONE SEEN
WBC, Wet Prep HPF POC: <10 (ref <10)
Yeast Wet Prep HPF POC: NONE SEEN

## 2023-04-20 NOTE — Discharge Instructions (Signed)

## 2023-04-20 NOTE — MAU Note (Signed)
.  Jill Rush is a 33 y.o. at [redacted]w[redacted]d here in MAU reporting: Patient reports spotting for 3 days light pink and brown   Onset of complaint: 3 days ago  Pain score: denies  There were no vitals filed for this visit.    Lab orders placed from triage:   none

## 2023-04-20 NOTE — MAU Provider Note (Addendum)
 History     CSN: [id number]  Arrival date and time: 04/20/23 1725   Event Date/Time   First Provider Initiated Contact with Patient 04/20/23 1909      Chief Complaint  Patient presents with   Vaginal Bleeding   HPI  Jill Rush is a 33 y.o. G2P0010 at [redacted]w[redacted]d who presents for evaluation of spotting. Patient reports 3 days of pink and brown spotting. No frank bleeding. She denies any pain. She had her pregnancy confirmed at [facility] on 2/18.  She denies any vaginal bleeding and discharge. Denies any constipation, diarrhea or any urinary complaints.   OB History     Gravida  2   Para      Term      Preterm      AB  1   Living         SAB  1   IAB  0   Ectopic      Multiple      Live Births              Past Medical History:  Diagnosis Date   Medical history non-contributory    No pertinent past medical history     Past Surgical History:  Procedure Laterality Date   NO PAST SURGERIES      Family History  Problem Relation Age of Onset   Hypertension Mother    Diabetes Sister        with pregnancy    Social History   Tobacco Use   Smoking status: Never    Passive exposure: Never   Smokeless tobacco: Never  Substance Use Topics   Alcohol use: Yes    Alcohol/week: 1.0 standard drink of alcohol    Types: 1 Standard drinks or equivalent per week   Drug use: No    Allergies: No Known Allergies  No medications prior to admission.    Review of Systems  Constitutional: Negative.  Negative for fatigue and fever.  HENT: Negative.    Respiratory: Negative.  Negative for shortness of breath.   Cardiovascular: Negative.  Negative for chest pain.  Gastrointestinal: Negative.  Negative for abdominal pain, constipation, diarrhea, nausea and vomiting.  Genitourinary:  Positive for vaginal bleeding. Negative for dysuria.  Neurological: Negative.  Negative for dizziness and headaches.   Physical Exam   Blood pressure 114/74, pulse 88,  temperature 98.2 F (36.8 C), resp. rate 16, last menstrual period 03/09/2023, SpO2 100%.  Patient Vitals for the past 24 hrs:  BP Temp Pulse Resp SpO2  04/20/23 1738 114/74 98.2 F (36.8 C) 88 16 100 %    Physical Exam Vitals and nursing note reviewed.  Constitutional:      General: She is not in acute distress.    Appearance: She is well-developed.  HENT:     Head: Normocephalic.  Eyes:     Pupils: Pupils are equal, round, and reactive to light.  Cardiovascular:     Rate and Rhythm: Normal rate and regular rhythm.     Heart sounds: Normal heart sounds.  Pulmonary:     Effort: Pulmonary effort is normal. No respiratory distress.     Breath sounds: Normal breath sounds.  Abdominal:     General: Bowel sounds are normal. There is no distension.     Palpations: Abdomen is soft.     Tenderness: There is no abdominal tenderness.  Skin:    General: Skin is warm and dry.  Neurological:     Mental Status: She is  alert and oriented to person, place, and time.  Psychiatric:        Mood and Affect: Mood normal.        Behavior: Behavior normal.        Thought Content: Thought content normal.        Judgment: Judgment normal.      MAU Course  Procedures  Results for orders placed or performed during the hospital encounter of 04/20/23 (from the past 24 hours)  CBC     Status: None   Collection Time: 04/20/23  5:46 PM  Result Value Ref Range   WBC 9.1 4.0 - 10.5 K/uL   RBC 4.24 3.87 - 5.11 MIL/uL   Hemoglobin 13.0 12.0 - 15.0 g/dL   HCT 16.1 09.6 - 04.5 %   MCV 90.8 80.0 - 100.0 fL   MCH 30.7 26.0 - 34.0 pg   MCHC 33.8 30.0 - 36.0 g/dL   RDW 40.9 81.1 - 91.4 %   Platelets 328 150 - 400 K/uL   nRBC 0.0 0.0 - 0.2 %  hCG, quantitative, pregnancy     Status: Abnormal   Collection Time: 04/20/23  5:46 PM  Result Value Ref Range   hCG, Beta Chain, Quant, S 218 (H) <5 mIU/mL  Wet prep, genital     Status: Abnormal   Collection Time: 04/20/23  5:47 PM  Result Value Ref Range    Yeast Wet Prep HPF POC NONE SEEN NONE SEEN   Trich, Wet Prep NONE SEEN NONE SEEN   Clue Cells Wet Prep HPF POC PRESENT (A) NONE SEEN   WBC, Wet Prep HPF POC <10 <10   Sperm NONE SEEN      US OB LESS THAN 14 WEEKS WITH OB TRANSVAGINAL Addendum Date: 04/20/2023 ADDENDUM REPORT: 04/20/2023 19:16 ADDENDUM: These results were called by telephone at the time of interpretation on 04/20/2023 at 7:16 pm to provider CAROLINE NEILL , who verbally acknowledged these results. Electronically Signed   By: Darliss Cheney M.D.   On: 04/20/2023 19:16   Result Date: 04/20/2023 CLINICAL DATA:  Spotting.  Pregnant. EXAM: OBSTETRIC <14 WK [place] AND TRANSVAGINAL OB US TECHNIQUE: Both transabdominal and transvaginal ultrasound examinations were performed for complete evaluation of the gestation as well as the maternal uterus, adnexal regions, and pelvic cul-de-sac. Transvaginal technique was performed to assess early pregnancy. COMPARISON:  None Available. FINDINGS: Intrauterine gestational sac: None. Endometrium: Appears normal measuring 10 mm in thickness. Right adnexa: The right ovary is visualized and appears normal measuring 2.2 x 2.4 x 1.3 cm. Left adnexa: The left ovary is visualized and appears normal measuring 2.3 x 1.4 x 1.4 cm. Within the left adnexa separate from the left ovary there is a thick rimmed hypoechoic area measuring 2.0 x 1.7 x 1.9 cm with some peripheral vascularity. Other: No pelvic free fluid. IMPRESSION: 1. No intrauterine gestational sac identified. 2. Findings suspicious for left adnexal ectopic pregnancy. Electronically Signed: By: Darliss Cheney M.D. On: 04/20/2023 19:11     MDM Labs ordered and reviewed.   UA, UPT CBC, HCG ABO/Rh- O Pos Wet prep and gc/chlamydia US OB Comp Less 14 weeks with Transvaginal  Reviewed results and presentation with Dr. Darra Lis- MD recommends repeat HCG in 48 hours with strict ectopic precautions  Discussed with client the diagnosis of pregnancy of unknown  anatomic location.  Three possibilities of outcome are: a healthy pregnancy that is too early to see a yolk sac to confirm the pregnancy is in the uterus, a pregnancy  that is not healthy and has not developed and will not develop, and an ectopic pregnancy that is in the abdomen that cannot be identified at this time.  And ectopic pregnancy can be a life threatening situation as a pregnancy needs to be in the uterus which is a muscle and can stretch to accommodate the growth of a pregnancy.  Other structures in the pelvis and abdomen as not muscular and do not stretch with the growth of a pregnancy.  Worst case scenario is that a structure ruptures with a growing pregnancy not in the uterus and and internal hemorrhage can be a life threatening situation.  We need to follow the progression of this pregnancy carefully.  We need to check another serum pregnancy hormone level to determine if the levels are rising appropriately  and to determine the next steps that are needed for you. Patient's questions were answered.  Assessment and Plan   1. Pregnancy of unknown anatomic location   2. [redacted] weeks gestation of pregnancy   3. Vaginal bleeding affecting early pregnancy    -Discharge home in stable condition -Strict ectopic precautions discussed -Patient advised to follow-up with [facility] on Monday for repeat HCG, appointment scheduled.  -Patient may return to MAU as needed or if her condition were to change or worsen  Rolm Bookbinder, CNM 04/20/2023, 7:10 PM    Since this patient has no pain and minimal bleeding with a single HCG level well below the discriminatory zone, it is best to avoid medical or surgical management before a normal intrauterine pregnancy is ruled out. I have seen the ultrasound images and the adnexal mass could represent a normal corpus luteum, that if removed, would end the pregnancy. A repeat beta hcg should be done in 48-72 hours and the patient should be given strict pain/bleeding  precautions.

## 2023-04-22 ENCOUNTER — Other Ambulatory Visit: Payer: Self-pay | Admitting: Obstetrics and Gynecology

## 2023-04-22 ENCOUNTER — Inpatient Hospital Stay (HOSPITAL_COMMUNITY): Payer: 59

## 2023-04-22 ENCOUNTER — Ambulatory Visit: Payer: Self-pay

## 2023-04-22 ENCOUNTER — Other Ambulatory Visit: Payer: Self-pay

## 2023-04-22 ENCOUNTER — Inpatient Hospital Stay (HOSPITAL_COMMUNITY)
Admission: AD | Admit: 2023-04-22 | Discharge: 2023-04-23 | Disposition: A | Discharge status: Home / Self Care | Payer: Self-pay | Attending: Obstetrics and Gynecology | Admitting: Obstetrics and Gynecology

## 2023-04-22 VITALS — BP 114/79 | HR 86 | Wt 176.2 lb

## 2023-04-22 DIAGNOSIS — O00102 Left tubal pregnancy without intrauterine pregnancy: Secondary | ICD-10-CM | POA: Insufficient documentation

## 2023-04-22 DIAGNOSIS — N939 Abnormal uterine and vaginal bleeding, unspecified: Secondary | ICD-10-CM

## 2023-04-22 DIAGNOSIS — M799 Soft tissue disorder, unspecified: Secondary | ICD-10-CM | POA: Diagnosis not present

## 2023-04-22 DIAGNOSIS — Z3A01 Less than 8 weeks gestation of pregnancy: Secondary | ICD-10-CM

## 2023-04-22 DIAGNOSIS — O3680X0 Pregnancy with inconclusive fetal viability, not applicable or unspecified: Secondary | ICD-10-CM

## 2023-04-22 DIAGNOSIS — Z3A00 Weeks of gestation of pregnancy not specified: Secondary | ICD-10-CM | POA: Diagnosis not present

## 2023-04-22 LAB — COMPREHENSIVE METABOLIC PANEL
ALT: 13 U/L (ref 0–44)
AST: 13 U/L — ABNORMAL LOW (ref 15–41)
Albumin: 3.6 g/dL (ref 3.5–5.0)
Alkaline Phosphatase: 33 U/L — ABNORMAL LOW (ref 38–126)
Anion gap: 8 (ref 5–15)
BUN: 8 mg/dL (ref 6–20)
CO2: 23 mmol/L (ref 22–32)
Calcium: 8.9 mg/dL (ref 8.9–10.3)
Chloride: 107 mmol/L (ref 98–111)
Creatinine, Ser: 0.85 mg/dL (ref 0.44–1.00)
GFR, Estimated: >60 mL/min (ref >60)
Glucose, Bld: 94 mg/dL (ref 70–99)
Potassium: 3.6 mmol/L (ref 3.5–5.1)
Sodium: 138 mmol/L (ref 135–145)
Total Bilirubin: 1.2 mg/dL (ref 0.0–1.2)
Total Protein: 8 g/dL (ref 6.5–8.1)

## 2023-04-22 LAB — GC/CHLAMYDIA PROBE AMP (~~LOC~~) NOT AT ARMC
Chlamydia: NEGATIVE
Comment: NEGATIVE
Comment: NORMAL
Neisseria Gonorrhea: NEGATIVE

## 2023-04-22 LAB — CBC
HCT: 39.4 % (ref 36.0–46.0)
Hemoglobin: 13.4 g/dL (ref 12.0–15.0)
MCH: 31.1 pg (ref 26.0–34.0)
MCHC: 34 g/dL (ref 30.0–36.0)
MCV: 91.4 fL (ref 80.0–100.0)
Platelets: 307 10*3/uL (ref 150–400)
RBC: 4.31 MIL/uL (ref 3.87–5.11)
RDW: 13.1 % (ref 11.5–15.5)
WBC: 7.1 10*3/uL (ref 4.0–10.5)
nRBC: 0 % (ref 0.0–0.2)

## 2023-04-22 LAB — ABO/RH: ABO/RH(D): O POS

## 2023-04-22 LAB — BETA HCG QUANT (REF LAB): hCG Quant: 199 m[IU]/mL

## 2023-04-22 LAB — HCG, QUANTITATIVE, PREGNANCY: hCG, Beta Chain, Quant, S: 240 m[IU]/mL — ABNORMAL HIGH (ref <5)

## 2023-04-22 MED ORDER — METHOTREXATE FOR ECTOPIC PREGNANCY
50.0000 mg/m2 | Freq: Once | INTRAMUSCULAR | Status: AC
  Administered 2023-04-23: 92.5 mg via INTRAMUSCULAR
  Filled 2023-04-22 (×2): qty 3.7

## 2023-04-22 NOTE — MAU Note (Signed)
.  Jill Rush is a 33 y.o. at [redacted]w[redacted]d here in MAU reporting: Here for methotrexate for "likely left ectopic pregnancy" per MD. Seen in MAU on 2/22 and instructed to go to [facility] for a repeat hcg with strict ectopic return precautions. Reports light vaginal bleeding with tiny cramps. She reports she began cramping today.  Beta HCG results: 04/20/23 @ 1746 218  04/22/23 @ 1135 199   Onset of complaint: 2/21 - spotting Pain score: 3/10 lower abdomen  Vitals:   04/22/23 1725  BP: 115/70  Pulse: 93  Resp: 16  Temp: 98.1 F (36.7 C)  SpO2: 100%     FHT: n/a Lab orders placed from triage: none

## 2023-04-22 NOTE — MAU Provider Note (Cosign Needed Addendum)
 History     CSN: [id number]  Arrival date and time: 04/22/23 1706   Event Date/Time   First Provider Initiated Contact with Patient 04/22/23 1852      Chief Complaint  Patient presents with   Follow-up   Jill Rush , a  33 y.o. G2P0010 at [redacted]w[redacted]d presents to MAU after being sent over from the office for a hovering quant with methotrexate management. Patient states that on Friday she was having cramping and spotting and reports that over the weekend the bleeding has increased. She states that with wiping she is having  bright red spotting. She denies saturating a pad but does endorse passing very "tiny" clots. She reports that the cramping has stopped but it comes and goes. She reports that this is a desired pregnancy.          OB History     Gravida  2   Para      Term      Preterm      AB  1   Living         SAB  1   IAB  0   Ectopic      Multiple      Live Births              Past Medical History:  Diagnosis Date   Medical history non-contributory    No pertinent past medical history     Past Surgical History:  Procedure Laterality Date   NO PAST SURGERIES      Family History  Problem Relation Age of Onset   Hypertension Mother    Diabetes Sister        with pregnancy    Social History   Tobacco Use   Smoking status: Never    Passive exposure: Never   Smokeless tobacco: Never  Substance Use Topics   Alcohol use: Yes    Alcohol/week: 1.0 standard drink of alcohol    Types: 1 Standard drinks or equivalent per week   Drug use: No    Allergies: No Known Allergies  No medications prior to admission.    Review of Systems  Constitutional:  Negative for chills, fatigue and fever.  Eyes:  Negative for pain and visual disturbance.  Respiratory:  Negative for apnea, shortness of breath and wheezing.   Cardiovascular:  Negative for chest pain and palpitations.  Gastrointestinal:  Negative for abdominal pain, constipation,  diarrhea, nausea and vomiting.  Genitourinary:  Positive for pelvic pain and vaginal bleeding. Negative for difficulty urinating, dysuria, vaginal discharge and vaginal pain.  Musculoskeletal:  Negative for back pain.  Neurological:  Negative for seizures, weakness and headaches.  Psychiatric/Behavioral:  Negative for suicidal ideas.    Physical Exam   Blood pressure 115/70, pulse 93, temperature 98.1 F (36.7 C), temperature source Oral, resp. rate 16, height 5' (1.524 m), weight 79.1 kg, last menstrual period 03/09/2023, SpO2 100%.  Physical Exam Vitals (Patient tearful) and nursing note reviewed.  Constitutional:      General: She is not in acute distress.    Appearance: Normal appearance.  HENT:     Head: Normocephalic.  Pulmonary:     Effort: Pulmonary effort is normal.  Musculoskeletal:     Cervical back: Normal range of motion.  Skin:    General: Skin is warm and dry.  Neurological:     Mental Status: She is alert and oriented to person, place, and time.  Psychiatric:  Mood and Affect: Mood normal.     MAU Course  Procedures Orders Placed This Encounter  Procedures   US OB Transvaginal   hCG, quantitative, pregnancy   CBC   Comprehensive metabolic panel   ABO/Rh   Results for orders placed or performed during the hospital encounter of 04/22/23 (from the past 24 hours)  ABO/Rh     Status: None   Collection Time: 04/22/23  6:12 PM  Result Value Ref Range   ABO/RH(D)      O POS Performed at [facility], [address] [place], [place] [address]   hCG, quantitative, pregnancy     Status: Abnormal   Collection Time: 04/22/23  6:14 PM  Result Value Ref Range   hCG, Beta Chain, Quant, S 240 (H) <5 mIU/mL  CBC     Status: None   Collection Time: 04/22/23  6:14 PM  Result Value Ref Range   WBC 7.1 4.0 - 10.5 K/uL   RBC 4.31 3.87 - 5.11 MIL/uL   Hemoglobin 13.4 12.0 - 15.0 g/dL   HCT 60.4 54.0 - 98.1 %   MCV 91.4 80.0 - 100.0 fL   MCH 31.1 26.0  - 34.0 pg   MCHC 34.0 30.0 - 36.0 g/dL   RDW 19.1 47.8 - 29.5 %   Platelets 307 150 - 400 K/uL   nRBC 0.0 0.0 - 0.2 %  Comprehensive metabolic panel     Status: Abnormal   Collection Time: 04/22/23  6:14 PM  Result Value Ref Range   Sodium 138 135 - 145 mmol/L   Potassium 3.6 3.5 - 5.1 mmol/L   Chloride 107 98 - 111 mmol/L   CO2 23 22 - 32 mmol/L   Glucose, Bld 94 70 - 99 mg/dL   BUN 8 6 - 20 mg/dL   Creatinine, Ser 6.21 0.44 - 1.00 mg/dL   Calcium 8.9 8.9 - 30.8 mg/dL   Total Protein 8.0 6.5 - 8.1 g/dL   Albumin 3.6 3.5 - 5.0 g/dL   AST 13 (L) 15 - 41 U/L   ALT 13 0 - 44 U/L   Alkaline Phosphatase 33 (L) 38 - 126 U/L   Total Bilirubin 1.2 0.0 - 1.2 mg/dL   GFR, Estimated >65 >78 mL/min   Anion gap 8 5 - 15   US OB Transvaginal Result Date: 04/22/2023 CLINICAL DATA:  Follow-up suspected left adnexal ectopic on prior ultrasound. Beta HCG 240, previously 218. EXAM: TRANSVAGINAL OB ULTRASOUND TECHNIQUE: Transvaginal ultrasound was performed for complete evaluation of the gestation as well as the maternal uterus, adnexal regions, and pelvic cul-de-sac. COMPARISON:  04/20/2023 FINDINGS: Intrauterine gestational sac: None Yolk sac:  Not Visualized. Embryo:  Not Visualized. Maternal uterus/adnexae: Bilateral ovaries are within normal limits. Tubular soft tissue prominence in the left adnexa. In this clinical context, this remains concerning for tubular ectopic pregnancy. IMPRESSION: No IUP is visualized. Tubular soft tissue prominence in the left adnexa. In this clinical context, this remains concerning for tubular ectopic pregnancy. Electronically Signed   By: Charline Bills M.D.   On: 04/22/2023 22:01    MDM - Given that patient desires pregnancy, plan to repeat quant and [place].  - H&H stable at this time.  - CMP ordered incase Methotrexate is needed.  - Consult to Dr. Alvester Morin and MD agrees concern for ectopic pregnancy.  - Recommendation for methotrexate and patient agreeable to plan of  care.   The risks of methotrexate were reviewed including failure requiring repeat dosing or  eventual surgery. She understands that methotrexate involves frequent return visits to monitor lab values and that she remains at risk of ectopic rupture until her beta is less than assay. ?The patient opts to proceed with methotrexate.  She has no history of hepatic or renal dysfunction, has normal BUN/Cr/LFT's/platelets.  She is felt to be reliable for follow-up. Side effects of photosensitivity & GI upset were discussed.  She knows to avoid direct sunlight and abstain from alcohol, NSAIDs and sexual intercourse for two weeks. She was counseled to discontinue any MVI with folic acid. ?She understands to follow up on D4 (Thursday ) and D7 (Sunday) for repeat BHCG and was given the instruction sheet. ?Strict ectopic precautions were reviewed, the patient knows to call with any abdominal pain, vomiting, fainting, or any concerns with her health.  Day 0/1 Day 4 Day 7  Sunday Wednesday Saturday  Monday Thursday Sunday  Tuesday Friday Monday  Wednesday Saturday Tuesday  Thursday Sunday Wednesday  Friday Monday Thursday  Saturday Tuesday Friday    - Transfer of care over to Dr. Alvester Morin, MD @ [address] (Danella Deis) Suzie Portela, MSN, CNM  Center for [facility]  04/22/2023 10:49 PM    Assessment and Plan

## 2023-04-22 NOTE — Progress Notes (Signed)
 Beta HCG Follow-up Visit  Jill Rush presents to [facility] for follow-up beta HCG lab. She was seen in MAU for  vaginal spotting  on 04/20/23. Vaginal swab showed clue cells. No treatment given for BV during MAU visit. Patient reports continued vaginal spotting, now a brighter red. Denies pain. Discussed with patient that we are following beta HCG levels today. Valid contact number for patient confirmed. I will call the patient with results.   Beta HCG results: 04/20/23 @ 1746 218  04/22/23 @ 1135 199   Results and patient history reviewed with Donavan Foil MD, who makes recommendation for methotrexate treatment of likely ectopic pregnancy. Patient called and informed of plan for follow-up; plans to present to MAU around 5 PM. MAU providers notified.  Marjo Bicker 04/22/2023 8:38 AM

## 2023-04-22 NOTE — Discharge Instructions (Signed)
 The risks of methotrexate were reviewed including failure requiring repeat dosing or eventual surgery. She understands that methotrexate involves frequent return visits to monitor lab values and that she remains at risk of ectopic rupture until her beta is less than assay. ?The patient opts to proceed with methotrexate.  She has no history of hepatic or renal dysfunction, has normal BUN/Cr/LFT's/platelets.  She is felt to be reliable for follow-up. Side effects of photosensitivity & GI upset were discussed.  She knows to avoid direct sunlight and abstain from alcohol, NSAIDs and sexual intercourse for two weeks. She was counseled to discontinue any MVI with folic acid. ?She understands to follow up on D4 (Thursday) and D7 (Sunday) for repeat BHCG and was given the instruction sheet. ?Strict ectopic precautions were reviewed, the patient knows to call with any abdominal pain, vomiting, fainting, or any concerns with her health.  Day 0/1 Day 4 Day 7  Sunday Wednesday Saturday  Monday Thursday Sunday  Tuesday Friday Monday  Wednesday Saturday Tuesday  Thursday Sunday Wednesday  Friday Monday Thursday  Saturday Tuesday Friday

## 2023-04-23 ENCOUNTER — Encounter: Payer: Self-pay | Admitting: Student

## 2023-04-25 ENCOUNTER — Ambulatory Visit: Payer: 59

## 2023-04-25 ENCOUNTER — Other Ambulatory Visit: Payer: Self-pay

## 2023-04-25 VITALS — BP 120/71 | HR 98 | Ht 61.0 in | Wt 174.4 lb

## 2023-04-25 DIAGNOSIS — O00102 Left tubal pregnancy without intrauterine pregnancy: Secondary | ICD-10-CM | POA: Diagnosis not present

## 2023-04-25 DIAGNOSIS — Z3A01 Less than 8 weeks gestation of pregnancy: Secondary | ICD-10-CM

## 2023-04-25 LAB — BETA HCG QUANT (REF LAB): hCG Quant: 233 m[IU]/mL

## 2023-04-25 NOTE — Progress Notes (Signed)
 Pt presents for stat BHCG following MAU visit for methotrexate on 2/24. Today is Jill Rush #4. She reports having no abdominal pain. She is having some light bleeding however less than on 2/24. Pt was advised to return to MAU if she develops heavy vaginal bleeding or increased abdominal/pelvic pain. BHCG drawn and pt was advised she will be called with results later today. She voiced understanding of all instructions given and stated that a detailed message can be left on voicemail if she is not able to answer.  1330  BHCG result (233) reviewed by Dr. Donavan Foil. Pt is recommended to continue with plan of care for BHCG on 3/1 @ [facility]/MAU. I called pt and provided this information. She voiced understanding and agreed to plan of care.

## 2023-04-28 ENCOUNTER — Inpatient Hospital Stay (HOSPITAL_COMMUNITY)
Admission: AD | Admit: 2023-04-28 | Discharge: 2023-04-28 | Disposition: A | Discharge status: Home / Self Care | Payer: Self-pay | Attending: Obstetrics and Gynecology | Admitting: Obstetrics and Gynecology

## 2023-04-28 DIAGNOSIS — Z79631 Long term (current) use of antimetabolite agent: Secondary | ICD-10-CM

## 2023-04-28 DIAGNOSIS — Z3A01 Less than 8 weeks gestation of pregnancy: Secondary | ICD-10-CM | POA: Diagnosis not present

## 2023-04-28 DIAGNOSIS — O009 Unspecified ectopic pregnancy without intrauterine pregnancy: Secondary | ICD-10-CM | POA: Diagnosis present

## 2023-04-28 DIAGNOSIS — Z5181 Encounter for therapeutic drug level monitoring: Secondary | ICD-10-CM | POA: Diagnosis not present

## 2023-04-28 LAB — COMPREHENSIVE METABOLIC PANEL
ALT: 9 U/L (ref 0–44)
AST: 16 U/L (ref 15–41)
Albumin: 3.7 g/dL (ref 3.5–5.0)
Alkaline Phosphatase: 33 U/L — ABNORMAL LOW (ref 38–126)
Anion gap: 11 (ref 5–15)
BUN: 7 mg/dL (ref 6–20)
CO2: 21 mmol/L — ABNORMAL LOW (ref 22–32)
Calcium: 8.9 mg/dL (ref 8.9–10.3)
Chloride: 105 mmol/L (ref 98–111)
Creatinine, Ser: 0.81 mg/dL (ref 0.44–1.00)
GFR, Estimated: >60 mL/min (ref >60)
Glucose, Bld: 105 mg/dL — ABNORMAL HIGH (ref 70–99)
Potassium: 3.7 mmol/L (ref 3.5–5.1)
Sodium: 137 mmol/L (ref 135–145)
Total Bilirubin: 1 mg/dL (ref 0.0–1.2)
Total Protein: 7.6 g/dL (ref 6.5–8.1)

## 2023-04-28 LAB — CBC WITH DIFFERENTIAL/PLATELET
Abs Immature Granulocytes: 0.01 10*3/uL (ref 0.00–0.07)
Basophils Absolute: 0 10*3/uL (ref 0.0–0.1)
Basophils Relative: 0 %
Eosinophils Absolute: 0 10*3/uL (ref 0.0–0.5)
Eosinophils Relative: 0 %
HCT: 38.4 % (ref 36.0–46.0)
Hemoglobin: 12.9 g/dL (ref 12.0–15.0)
Immature Granulocytes: 0 %
Lymphocytes Relative: 41 %
Lymphs Abs: 2.5 10*3/uL (ref 0.7–4.0)
MCH: 30.9 pg (ref 26.0–34.0)
MCHC: 33.6 g/dL (ref 30.0–36.0)
MCV: 91.9 fL (ref 80.0–100.0)
Monocytes Absolute: 0.4 10*3/uL (ref 0.1–1.0)
Monocytes Relative: 6 %
Neutro Abs: 3.2 10*3/uL (ref 1.7–7.7)
Neutrophils Relative %: 53 %
Platelets: 281 10*3/uL (ref 150–400)
RBC: 4.18 MIL/uL (ref 3.87–5.11)
RDW: 12.8 % (ref 11.5–15.5)
WBC: 6 10*3/uL (ref 4.0–10.5)
nRBC: 0 % (ref 0.0–0.2)

## 2023-04-28 LAB — HCG, QUANTITATIVE, PREGNANCY: hCG, Beta Chain, Quant, S: 256 m[IU]/mL — ABNORMAL HIGH (ref <5)

## 2023-04-28 MED ORDER — METHOTREXATE FOR ECTOPIC PREGNANCY
50.0000 mg/m2 | Freq: Once | INTRAMUSCULAR | Status: AC
  Administered 2023-04-28: 92.5 mg via INTRAMUSCULAR
  Filled 2023-04-28: qty 3.7

## 2023-04-28 MED ORDER — CYCLOBENZAPRINE HCL 10 MG PO TABS: 10.0000 mg | ORAL_TABLET | Freq: Two times a day (BID) | ORAL | 0 refills | Status: DC | PRN

## 2023-04-28 MED ORDER — CYCLOBENZAPRINE HCL 5 MG PO TABS
10.0000 mg | ORAL_TABLET | Freq: Once | ORAL | Status: AC
  Administered 2023-04-28: 10 mg via ORAL
  Filled 2023-04-28: qty 2

## 2023-04-28 NOTE — Discharge Instructions (Signed)
 The risks of methotrexate were reviewed including failure requiring repeat dosing or eventual surgery. She understands that methotrexate involves frequent return visits to monitor lab values and that she remains at risk of ectopic rupture until her beta is less than assay. ?The patient opts to proceed with methotrexate.  She has no history of hepatic or renal dysfunction, has normal BUN/Cr/LFT's/platelets.  She is felt to be reliable for follow-up. Side effects of photosensitivity & GI upset were discussed.  She knows to avoid direct sunlight and abstain from alcohol, NSAIDs and sexual intercourse for two weeks. She was counseled to discontinue any MVI with folic acid. ?She understands to follow up on D4 (3/5) and D7 (3/8) for repeat BHCG and was given the instruction sheet. ?Strict ectopic precautions were reviewed, the patient knows to call with any abdominal pain, vomiting, fainting, or any concerns with her health.  Day 0/1 Day 4 Day 7  Sunday Wednesday Saturday  Monday Thursday Sunday  Tuesday Friday Monday  Wednesday Saturday Tuesday  Thursday Sunday Wednesday  Friday Monday Thursday  Saturday Tuesday Friday

## 2023-04-28 NOTE — MAU Note (Signed)
..  Jill Rush is a 33 y.o. at [redacted]w[redacted]d here in MAU reporting: day 7 s/p methotrexate. States she has had some intermittent cramping, but denies pain currently. No VB.   Pain score: 0 Vitals:   04/28/23 0903  BP: 114/69  Pulse: 79  Resp: 14  Temp: 97.8 F (36.6 C)  SpO2: 100%      Lab orders placed from triage:   hcg

## 2023-04-28 NOTE — MAU Provider Note (Signed)
 S [name] is a 33 y.o. G2P0010 patient who presents to MAU today s/p methotrexate on 04/22/23 for Day 7 hcg level. She reports that she has had no more bleeding but reports mild intermittent cramping for the past 3 days and has been taking Tylenol 1gm, last dose @ 0730 AM. Denies active cramping, pain, or VB    O BP 114/69 (BP Location: Right Arm)   Pulse 79   Temp 97.8 F (36.6 C) (Oral)   Resp 14   LMP 03/09/2023   SpO2 100%   Patient Vitals for the past 24 hrs:  BP Temp Temp src Pulse Resp SpO2  04/28/23 0903 114/69 97.8 F (36.6 C) Oral 79 14 100 %      Physical Exam Vitals and nursing note reviewed.  Constitutional:      General: She is not in acute distress.    Appearance: Normal appearance. She is not ill-appearing.  HENT:     Head: Normocephalic.     Nose: Nose normal.     Mouth/Throat:     Mouth: Mucous membranes are moist.  Eyes:     Pupils: Pupils are equal, round, and reactive to light.  Cardiovascular:     Rate and Rhythm: Normal rate.  Pulmonary:     Effort: Pulmonary effort is normal.     Breath sounds: Normal breath sounds.  Abdominal:     Palpations: Abdomen is soft.  Musculoskeletal:     Cervical back: Normal range of motion.  Skin:    General: Skin is warm and dry.  Neurological:     Mental Status: She is alert and oriented to person, place, and time.  Psychiatric:        Mood and Affect: Mood normal.        Behavior: Behavior normal.        Thought Content: Thought content normal.        Judgment: Judgment normal.      Lab Results  Component Value Date   HCGQUANT 233 04/25/2023   HCGQUANT 199 04/22/2023   HCGQUANT 3 08/21/2017   HCGQUANT 31 08/06/2017   HCGQUANT 210 07/31/2017   HCGQUANT 6,664 07/24/2017   HCGQUANT 32,724 07/10/2017   HCGQUANT 20,432 06/27/2017     MDM  HIGH  Hcg s/p methotrexate received on 04/12/23   D4 Hcg 233  D7 Hcg : 256   D/W Eure ( OB Attending at 1035) Will plan for  second dose  of methotrexate methotrexate 50 mg/m2 IM ) Orders placed , d/t rise in Hcg Quant)    I have reviewed the patient chart and performed the physical exam . I have ordered & interpreted the lab results and consulted with the attending physician  Medications ordered as stated below.  A/P as described below.  Counseling and education provided and patient agreeable  with plan as described below. Verbalized understanding.     Orders Placed This Encounter  Procedures   hCG, quantitative, pregnancy    Standing Status:   Standing    Number of Occurrences:   1   CBC with Differential/Platelet    Standing Status:   Standing    Number of Occurrences:   1   Comprehensive metabolic panel    Standing Status:   Standing    Number of Occurrences:   1   CBC with Differential/Platelet    Standing Status:   Standing    Number of Occurrences:   1   Notify physician (specify) Call  MD with lab results prior to MTX administration.    Call MD with lab results prior to MTX administration.    Standing Status:   Standing    Number of Occurrences:   1   Patient may leave MAU but must stay on hospital grounds.    Standing Status:   Standing    Number of Occurrences:   1   Notify Physician if patient has had previous chemotherapy treatment and the date    Notify Physician    Standing Status:   Standing    Number of Occurrences:   1     The risks of methotrexate were reviewed including failure requiring repeat dosing or eventual surgery. She understands that methotrexate involves frequent return visits to monitor lab values and that she remains at risk of ectopic rupture until her beta is less than assay. ?The patient opts to proceed with methotrexate.  She has no history of hepatic or renal dysfunction, has normal BUN/Cr/LFT's/platelets.  She is felt to be reliable for follow-up. Side effects of photosensitivity & GI upset were discussed.  She knows to avoid direct sunlight and abstain from alcohol, NSAIDs and  sexual intercourse for two weeks. She was counseled to discontinue any MVI with folic acid. ?She understands to follow up on D4 (3/5) and D7 (3/8) for repeat BHCG and was given the instruction sheet.  ?Strict ectopic precautions were reviewed, the patient knows to call with any abdominal pain, vomiting, fainting, or any concerns with her health.  Day 0/1 Day 4 Day 7  Sunday 04/28/23 Wednesday 3/5 Saturday 3/8  Monday Thursday Sunday  Tuesday Friday Monday  Wednesday Saturday Tuesday  Thursday Sunday Wednesday  Friday Monday Thursday  Saturday Tuesday Friday      ASSESSMENT Medical screening exam complete  1. Ectopic pregnancy, unspecified location, unspecified whether intrauterine pregnancy present  2. Encounter for monitoring of methotrexate therapy (Primary)     PLAN  Future Appointments  Date Time Provider Department Center  05/01/2023  8:30 AM [facility] FU [facility]     Discharge from MAU in stable condition Patient verbalized understanding and is agreeable to plan as described above with f/u scheduled Warning signs for worsening condition that would warrant emergency follow-up discussed Patient may return to MAU as needed   Colman Cater, NP 04/28/2023 1:17 PM

## 2023-04-29 LAB — CBC/D/PLT+RPR+RH+ABO+RUBIGG...
Basophils Absolute: 0 10*3/uL (ref 0.0–0.2)
Basos: 0 %
Bilirubin, UA: NEGATIVE
EOS (ABSOLUTE): 0.1 10*3/uL (ref 0.0–0.4)
Eos: 1 %
Glucose, UA: NEGATIVE
HCV Ab: NONREACTIVE
HIV Screen 4th Generation wRfx: NONREACTIVE
Hematocrit: 39.7 % (ref 34.0–46.6)
Hemoglobin: 13.3 g/dL (ref 11.1–15.9)
Hepatitis B Surface Ag: NEGATIVE
Immature Grans (Abs): 0 10*3/uL (ref 0.0–0.1)
Immature Granulocytes: 0 %
Leukocytes,UA: NEGATIVE
Lymphocytes Absolute: 3.1 10*3/uL (ref 0.7–3.1)
Lymphs: 42 %
MCH: 30.9 pg (ref 26.6–33.0)
MCHC: 33.5 g/dL (ref 31.5–35.7)
MCV: 92 fL (ref 79–97)
Monocytes Absolute: 0.5 10*3/uL (ref 0.1–0.9)
Monocytes: 6 %
Neutrophils Absolute: 3.8 10*3/uL (ref 1.4–7.0)
Neutrophils: 51 %
Nitrite, UA: NEGATIVE
Platelets: 334 10*3/uL (ref 150–450)
Protein,UA: NEGATIVE
RBC, UA: NEGATIVE
RBC: 4.31 x10E6/uL (ref 3.77–5.28)
RDW: 12.6 % (ref 11.7–15.4)
RPR Ser Ql: NONREACTIVE
Rh Factor: POSITIVE
Rubella Antibodies, IGG: 7.48 {index} (ref >0.99)
Specific Gravity, UA: 1.021 (ref 1.005–1.030)
Urobilinogen, Ur: 0.2 mg/dL (ref 0.2–1.0)
WBC: 7.5 10*3/uL (ref 3.4–10.8)
pH, UA: 6.5 (ref 5.0–7.5)

## 2023-04-29 LAB — HGB FRACTIONATION CASCADE
Hgb A2: 2.5 % (ref 1.8–3.2)
Hgb A: 97.5 % (ref 96.4–98.8)
Hgb F: 0 % (ref 0.0–2.0)
Hgb S: 0 %

## 2023-04-29 LAB — URINE CULTURE, OB REFLEX

## 2023-04-29 LAB — MICROSCOPIC EXAMINATION
Bacteria, UA: NONE SEEN
Casts: NONE SEEN /LPF

## 2023-04-29 LAB — HCV INTERPRETATION

## 2023-04-29 LAB — AB SCR+ANTIBODY ID

## 2023-05-01 ENCOUNTER — Other Ambulatory Visit: Payer: Self-pay

## 2023-05-01 ENCOUNTER — Ambulatory Visit: Payer: Self-pay

## 2023-05-01 VITALS — BP 118/70 | HR 79 | Ht 61.0 in | Wt 174.4 lb

## 2023-05-01 DIAGNOSIS — O00102 Left tubal pregnancy without intrauterine pregnancy: Secondary | ICD-10-CM

## 2023-05-01 LAB — BETA HCG QUANT (REF LAB): hCG Quant: 195 m[IU]/mL

## 2023-05-01 NOTE — Progress Notes (Signed)
 Pt presents for stat BHCG - Jill Rush #4 after methotrexate.  She reports increased intermittent abdominal cramping which started yesterday.  She is taking Flexeril as prescribed with good relief. Pt denies vaginal bleeding. Stat BHCG drawn and pt will be called later today with results.  She understands that she will need Jill Rush #7 BHCG on 3/8 @ MAU.   1630  BHCG result (195) reviewed by Dr. Alvester Morin who recommends pt continue with plan of care to have BHCG on 3/8 as previously instructed. I called pt and provided test result information and confirmed plan of care.  She voiced understanding.

## 2023-05-04 ENCOUNTER — Inpatient Hospital Stay (HOSPITAL_COMMUNITY)
Admission: AD | Admit: 2023-05-04 | Discharge: 2023-05-04 | Disposition: A | Discharge status: Home / Self Care | Attending: Obstetrics and Gynecology | Admitting: Obstetrics and Gynecology

## 2023-05-04 DIAGNOSIS — Z3A08 8 weeks gestation of pregnancy: Secondary | ICD-10-CM | POA: Diagnosis not present

## 2023-05-04 DIAGNOSIS — Z3A01 Less than 8 weeks gestation of pregnancy: Secondary | ICD-10-CM

## 2023-05-04 DIAGNOSIS — O00102 Left tubal pregnancy without intrauterine pregnancy: Secondary | ICD-10-CM | POA: Diagnosis present

## 2023-05-04 LAB — HCG, QUANTITATIVE, PREGNANCY: hCG, Beta Chain, Quant, S: 154 m[IU]/mL — ABNORMAL HIGH (ref <5)

## 2023-05-04 NOTE — MAU Provider Note (Signed)
 History   Chief Complaint:  Follow-up   Jill Rush is  33 y.o. G2P0010 Patient's last menstrual period was 03/09/2023.Marland Kitchen Patient is here for follow up of quantitative HCG and ongoing surveillance of pregnancy status. She is [redacted]w[redacted]d weeks gestation  by LMP. She was diagnosed with a left ectopic pregnancy on 04/22/23.      Since her last visit, the patient is without new complaint. The patient reports bleeding as  none now.  She denies any pain.   General ROS:  negative   Her previous Quantitative HCG values are: 2/22: 218 2/24: 240- Methotrexate given 2/27: 233 3/2: 256- MTX#2 3/5: 195  Physical Exam   Blood pressure 116/77, pulse 85, temperature 98.1 F (36.7 C), temperature source Oral, resp. rate 15, height 5\' 1"  (1.549 m), weight 80.2 kg, last menstrual period 03/09/2023, SpO2 99%.   Physical Exam Vitals and nursing note reviewed.  Constitutional:      General: She is not in acute distress.    Appearance: She is well-developed.  HENT:     Head: Normocephalic.  Eyes:     Pupils: Pupils are equal, round, and reactive to light.  Cardiovascular:     Rate and Rhythm: Normal rate and regular rhythm.  Pulmonary:     Effort: Pulmonary effort is normal. No respiratory distress.     Breath sounds: Normal breath sounds.  Abdominal:     Palpations: Abdomen is soft.     Tenderness: There is no abdominal tenderness.  Musculoskeletal:        General: Normal range of motion.     Cervical back: Normal range of motion.  Skin:    General: Skin is warm and dry.  Neurological:     Mental Status: She is alert and oriented to person, place, and time.  Psychiatric:        Behavior: Behavior normal.        Thought Content: Thought content normal.        Judgment: Judgment normal.       Labs: Results for orders placed or performed during the hospital encounter of 05/04/23 (from the past 24 hours)  hCG, quantitative, pregnancy   Collection Time: 05/04/23  9:10 AM  Result  Value Ref Range   hCG, Beta Chain, Quant, S 154 (H) <5 mIU/mL    Assessment:   1. 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 Berton Lan. 21% decrease in HCG since last visit. Ok to discharge home with weekly HCG in office  Plan: -Discharge home in stable condition -Vaginal bleeding/pain precautions discussed -Patient advised to follow-up with OB as scheduled for repeat labs, message sent to schedule.  -Patient may return to MAU as needed or if her condition were to change or worsen  Rolm Bookbinder, CNM 05/04/2023, 10:31 AM

## 2023-05-04 NOTE — MAU Note (Signed)
 Jill Rush is a 33 y.o. at [redacted]w[redacted]d here in MAU reporting: here today for f/u - day 7 post 2nd Methotrexate injection.  Is doing ok, denies any pain.  Is having red spotting, only when she wipes.   Onset of complaint: ongoing Pain score: none Vitals:   05/04/23 0846  BP: 116/77  Pulse: 85  Resp: 15  Temp: 98.1 F (36.7 C)  SpO2: 99%      Lab orders placed from triage:

## 2023-05-14 ENCOUNTER — Other Ambulatory Visit: Payer: Self-pay

## 2023-05-14 DIAGNOSIS — O00102 Left tubal pregnancy without intrauterine pregnancy: Secondary | ICD-10-CM

## 2023-05-17 ENCOUNTER — Other Ambulatory Visit: Payer: Self-pay

## 2023-05-17 ENCOUNTER — Other Ambulatory Visit

## 2023-05-17 DIAGNOSIS — O00102 Left tubal pregnancy without intrauterine pregnancy: Secondary | ICD-10-CM

## 2023-05-18 LAB — BETA HCG QUANT (REF LAB): hCG Quant: <1 m[IU]/mL

## 2023-05-21 ENCOUNTER — Encounter: Payer: Self-pay | Admitting: Obstetrics and Gynecology

## 2023-06-02 ENCOUNTER — Encounter: Payer: Self-pay | Admitting: Student

## 2023-06-06 ENCOUNTER — Encounter: Payer: Self-pay | Admitting: Family Medicine

## 2023-06-06 ENCOUNTER — Ambulatory Visit (INDEPENDENT_AMBULATORY_CARE_PROVIDER_SITE_OTHER): Admitting: Family Medicine

## 2023-06-06 VITALS — BP 100/80 | HR 97 | Ht 59.0 in | Wt 165.8 lb

## 2023-06-06 DIAGNOSIS — F430 Acute stress reaction: Secondary | ICD-10-CM

## 2023-06-06 NOTE — Patient Instructions (Signed)
 It was wonderful to see you today.  Please bring ALL of your medications with you to every visit.   Today we talked about:  Keep trying to eat foods you enjoy and that have calories! I am glad you are starting to find some peace, this is hard!  If you decide you want to talk about medications for mood or for sleep let me know, please make a follow up in 2-4 weeks so we can check in!  Thank you for choosing [facility].   Please call [phone] with any questions about today's appointment.  Please arrive at least 15 minutes prior to your scheduled appointments.   If you had blood work today, I will send you a MyChart message or a letter if results are normal. Otherwise, I will give you a call.   If you had a referral placed, they will call you to set up an appointment. Please give [place] a call if you don't hear back in the next 2 weeks.   If you need additional refills before your next appointment, please call your pharmacy first.   Burley Saver, MD  Family Medicine

## 2023-06-06 NOTE — Progress Notes (Signed)
    SUBJECTIVE:   CHIEF COMPLAINT / HPI:   Acute grief reaction-  in the past month, she had a miscarriage from tubal pregnancy, end of her relationship, and her aunt passed away. She has a therapist that she sees once a month and supportive friends and family to talk to about it. She notes 2-3 days before her aunts funeral she had an episode of crying where she couldn't catch her breath and her chest felt tight and she felt like she was having a panic attack. She has not had this before and not had another episode since. 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 She has had an 11 lb weight loss since last month- notes decreased appetite and feeling sad, just doesn't have appetite. Denies urinary issues or bowel issues. She does go to the gym 2x week for weight lifting.   OBJECTIVE:   BP 100/80   Pulse 97   Ht 4\' 11"  (1.499 m)   Wt 165 lb 12.8 oz (75.2 kg)   LMP 03/09/2023   SpO2 100%   Breastfeeding Unknown   BMI 33.49 kg/m   General: A&O, NAD HEENT: No sign of trauma, EOM grossly intact Cardiac: RRR, no m/r/g Respiratory: CTAB, normal WOB, no w/c/r GI:  non-distended  Neuro: Normal gait, moves all four extremities appropriately. Psych: Appropriate mood and affect, tearful at times  ASSESSMENT/PLAN:   Assessment & Plan Acute reaction to stress With recent loss of aunt, end of relationship, and miscarriage Provided supportive listening, has therapist and encouraged her to continue seeing perhaps even more frequently. Introduced idea of medications for both mood and sleep, she would like to think on this as she is starting to feel better and find peace and check in in 2-4 weeks with me, denies SI/HI Suspect weight loss 2/2, will follow up at next appt to ensure no other causes of weight loss and appetite is  improving.     Billey Co, MD [facility]

## 2023-06-13 ENCOUNTER — Telehealth: Payer: Self-pay

## 2023-06-13 ENCOUNTER — Other Ambulatory Visit: Payer: Self-pay | Admitting: Family Medicine

## 2023-06-13 DIAGNOSIS — A6000 Herpesviral infection of urogenital system, unspecified: Secondary | ICD-10-CM

## 2023-06-13 MED ORDER — VALACYCLOVIR HCL 1 G PO TABS: ORAL_TABLET | ORAL | 5 refills | Status: AC

## 2023-06-13 NOTE — Telephone Encounter (Signed)
 Sent in prescription for valacyclovir 1000 mg tablet, take one table daily for five days as asson as possible onet of symptoms. Indication recurrent herpes genitalis   Disp #10 so patient has refill ready to hand RF 5

## 2023-06-13 NOTE — Progress Notes (Unsigned)
 CLORINE SWING is {Pc accompanied by:5710} Sources of clinical information for visit is/are {Information source:60032}. Nursing assessment for this office visit was reviewed with the patient for accuracy and revision.     Previous Report(s) Reviewed: {Outside review:15817}     04/16/2023    2:00 PM  Depression screen PHQ 2/9  Decreased Interest 0  Down, Depressed, Hopeless 0  PHQ - 2 Score 0  Altered sleeping 0  Tired, decreased energy 0  Change in appetite 0  Feeling bad or failure about yourself  0  Trouble concentrating 0  Moving slowly or fidgety/restless 0  Suicidal thoughts 0  PHQ-9 Score 0   Flowsheet Row Office Visit from 04/16/2023 in [facility] - A [facility] [place]. [facility] Office Visit from 07/20/2021 in [facility] - A Dept Of [place] [facility]. [facility] Office Visit from 07/06/2021 in [facility] - A Dept Of [place]  Thoughts that you would be better off dead, or of hurting yourself in some way Not at all Not at all Not at all  PHQ-9 Total Score 0 0 0          05/24/2020    9:21 AM 11/28/2018   11:07 AM 06/11/2018   10:27 AM 06/11/2017    8:56 AM  Fall Risk   Falls in the past year? 0 0 0 No  Number falls in past yr: 0 0    Injury with Fall? 0     Follow up  Falls evaluation completed         04/16/2023    2:00 PM 07/20/2021    4:17 PM 07/06/2021   10:27 AM  PHQ9 SCORE ONLY  PHQ-9 Total Score 0 0 0    There are no preventive care reminders to display for this patient.  Health Maintenance Due  Topic Date Due   HPV VACCINES (2 - 3-dose series) 10/29/2006   COVID-19 Vaccine (3 - 2024-25 season) 10/28/2022   Cervical Cancer Screening (HPV/Pap Cotest)  05/25/2023      History/P.E. limitations: {exam; limitations ed:60112}  There are no preventive care reminders to display for this patient. There are no preventive care reminders to display for this patient.  Health  Maintenance Due  Topic Date Due   HPV VACCINES (2 - 3-dose series) 10/29/2006   COVID-19 Vaccine (3 - 2024-25 season) 10/28/2022   Cervical Cancer Screening (HPV/Pap Cotest)  05/25/2023     No chief complaint on file.    --------------------------------------------------------------------------------------------------------------------------------------------- Visit Problem List with A/P  No problem-specific Assessment & Plan notes found for this encounter.

## 2023-06-27 NOTE — Progress Notes (Signed)
    SUBJECTIVE:   CHIEF COMPLAINT / HPI:   Follow up mood - she is feeling much better. Denies SI. Has been seeing her therapist and taking care of herself, taking days off of work, praying, reading, meditating. Spending time with her 59 nieces and 3 nephews. Sleeping better. Does not feel she needs medications for her mood at this time.   Weight loss- her appetite is back and she is up 7 lbs from her last visit. She is no longer unintentionally losing weight, but would like to lose weight for her health in general. Has family history of HTN and does not want to develop this. She exercises 3x a week, on treadmill, incline steps, arm presses, and walking. She tries to walk 10K steps per day. She also has an app on her phone and is calorie counting, and a smart scale to measure her foods.   OBJECTIVE:   BP 110/70   Pulse 66   Ht 4\' 11"  (1.499 m)   Wt 172 lb (78 kg)   LMP 06/23/2023   SpO2 98%   BMI 34.74 kg/m   General: A&O, NAD HEENT: No sign of trauma, EOM grossly intact Cardiac: RRR, no m/r/g Respiratory: CTAB, normal WOB, no w/c/r GI: Soft, NTTP, non-distended  Extremities: NTTP, no peripheral edema. Neuro: Normal gait, moves all four extremities appropriately. Psych: Appropriate mood and affect   ASSESSMENT/PLAN:   Assessment & Plan BMI 34.0-34.9,adult Discussed healthy snack options to help with cravings and keep in calorie count Encouraged continued diet and exercise changes  Did discuss medications and she would like to work on diet and exercise first Will check lipids, A1c wnl F/u 1-2 months Acute reaction to stress Much improved, in therapy, denies SI or need for medications at this time     Charmel Cooter, MD [facility]

## 2023-06-28 ENCOUNTER — Encounter: Payer: Self-pay | Admitting: Family Medicine

## 2023-06-28 ENCOUNTER — Ambulatory Visit (INDEPENDENT_AMBULATORY_CARE_PROVIDER_SITE_OTHER): Admitting: Family Medicine

## 2023-06-28 VITALS — BP 110/70 | HR 66 | Ht 59.0 in | Wt 172.0 lb

## 2023-06-28 DIAGNOSIS — Z6834 Body mass index (BMI) 34.0-34.9, adult: Secondary | ICD-10-CM | POA: Diagnosis not present

## 2023-06-28 DIAGNOSIS — F430 Acute stress reaction: Secondary | ICD-10-CM | POA: Diagnosis not present

## 2023-06-28 LAB — POCT GLYCOSYLATED HEMOGLOBIN (HGB A1C): Hemoglobin A1C: 4.9 % (ref 4.0–5.6)

## 2023-06-28 NOTE — Patient Instructions (Addendum)
 It was wonderful to see you today.  Please bring ALL of your medications with you to every visit.   Today we talked about:  Goals for weight loss! Finding healthier sweet and salty snacks for when you are craving, continue exercising!  Please schedule a follow up in 1-2 months.  I am so glad you mood is feeling better- if you need anything please don't hesitate to let me know!   Thank you for choosing [facility].   Please call [phone] with any questions about today's appointment.  Please arrive at least 15 minutes prior to your scheduled appointments.   If you had blood work today, I will send you a MyChart message or a letter if results are normal. Otherwise, I will give you a call.   If you had a referral placed, they will call you to set up an appointment. Please give us  a call if you don't hear back in the next 2 weeks.   If you need additional refills before your next appointment, please call your pharmacy first.   Avanell Bob, MD  Family Medicine

## 2023-06-29 LAB — LIPID PANEL
Chol/HDL Ratio: 3.9 ratio (ref 0.0–4.4)
Cholesterol, Total: 189 mg/dL (ref 100–199)
HDL: 49 mg/dL (ref >39)
LDL Chol Calc (NIH): 128 mg/dL — ABNORMAL HIGH (ref 0–99)
Triglycerides: 66 mg/dL (ref 0–149)
VLDL Cholesterol Cal: 12 mg/dL (ref 5–40)

## 2023-07-12 ENCOUNTER — Ambulatory Visit

## 2023-07-12 NOTE — Progress Notes (Deleted)
  SUBJECTIVE:   CHIEF COMPLAINT / HPI:   Diarrhea, vomiting:  PERTINENT  PMH / PSH: Recent miscarriage from tubal pregnancy  OBJECTIVE:  LMP 06/23/2023  ***  ASSESSMENT/PLAN:   Assessment & Plan  No follow-ups on file. Jill Goon, DO 07/12/2023, 11:45 AM PGY-3, [facility] {    This will disappear when note is signed, click to select method of visit    :1}

## 2023-09-24 ENCOUNTER — Ambulatory Visit (INDEPENDENT_AMBULATORY_CARE_PROVIDER_SITE_OTHER)

## 2023-09-24 VITALS — BP 105/71 | HR 100 | Wt 170.2 lb

## 2023-09-24 DIAGNOSIS — B309 Viral conjunctivitis, unspecified: Secondary | ICD-10-CM | POA: Diagnosis not present

## 2023-09-24 NOTE — Progress Notes (Signed)
    SUBJECTIVE:   CHIEF COMPLAINT / HPI: concern for right eye pink eye  Yesterday she was walking outside went home and her right eye started feeling dry. Uncertain if anything scratched or flew into her eye. Tried visene with a little benefit Woke up with watering and crusting, itching, sore, and pink. No vision changes or inability to move her eye. No purulent discharge. She does not wear contacts.   PERTINENT  PMH / PSH: none  OBJECTIVE:   BP 105/71   Pulse 100   Wt 170 lb 3.2 oz (77.2 kg)   SpO2 100%   BMI 34.38 kg/m     Right eye: mild edema of the upper and lower lid. Injection of the conjunctiva. No discharge. PERL. Extraocular muscles intact. Fluorescein staining performed without any evidence of focal lesion. Left eye without injection  ASSESSMENT/PLAN:   Assessment & Plan Acute viral conjunctivitis of right eye Recommended continued use of Visene eye drops Educated on expected course of viral illness. Return precautions discussed.   Follow up PRN.     Ashana Tullo Alena Morrison, MD [facility]

## 2023-09-24 NOTE — Patient Instructions (Addendum)
 You have pink eye from a virus. Keep using your visene eye drops to help with the discomfort. It will probably get worse in the first 3-5 days then get better after that.  This might spread to the left eye since it is so contagious. If you notice any changes in your vision, new pain, or clumpy/ pus-like discharge call us  back because that might indicate a bacteria is causing the infection.  This is very contagious so make sure you do not touch your eye/face and wash your hands very frequently so you do not spread it.  Best, Dr. Alena

## 2023-12-06 ENCOUNTER — Ambulatory Visit (INDEPENDENT_AMBULATORY_CARE_PROVIDER_SITE_OTHER)

## 2023-12-06 VITALS — BP 111/71 | HR 70 | Ht 59.0 in | Wt 168.8 lb

## 2023-12-06 DIAGNOSIS — R197 Diarrhea, unspecified: Secondary | ICD-10-CM

## 2023-12-06 DIAGNOSIS — R112 Nausea with vomiting, unspecified: Secondary | ICD-10-CM | POA: Diagnosis not present

## 2023-12-06 MED ORDER — ONDANSETRON 8 MG PO TBDP: 8.0000 mg | ORAL_TABLET | Freq: Three times a day (TID) | ORAL | 1 refills | Status: AC | PRN

## 2023-12-06 MED ORDER — LOPERAMIDE HCL 2 MG PO TABS: 2.0000 mg | ORAL_TABLET | Freq: Four times a day (QID) | ORAL | 0 refills | Status: AC | PRN

## 2023-12-06 NOTE — Progress Notes (Signed)
    SUBJECTIVE:   CHIEF COMPLAINT / HPI:   GI Distress - Just came back from a cruise. Did not drink water off ship or eat any local foods - 2 days of nausea and GI cramping - In past day, vomiting x4, diarrhea x 3 - Not able to keep any foods or liquid down.   - Tried crackers and ginger ale without success - Mild HA and lightheadedness. Urine is normal color   PERTINENT  PMH / PSH: none  OBJECTIVE:   BP 111/71   Pulse 70   Ht 4' 11 (1.499 m)   Wt 168 lb 12.8 oz (76.6 kg)   SpO2 100%   BMI 34.09 kg/m   Physical Exam General: Alert, conversant, cooperative. No acute distress.  HEENT: PERRL. EOMI. MMM.  Cardiovascular: RRR Respiratory: Lungs CTAB. Normal work of breathing. Abdomen: Non distended  Skin: Warm. Dry. No rashes. No icterus.  Neurologic: No focal deficits. Moving all extremities. Psychiatric: Cooperative. Appropriate mood. Appropriate affect.   ASSESSMENT/PLAN:   Assessment & Plan Diarrhea of presumed infectious origin Suspect viral after cruise. Appears well hydrated today, normal HR and BP. Given zofran  for symptomatic improvement, as well as imodium . Discussed if she continues to to tolerate PO fluids, she will likely need IV fluids at urgent care or ED. She will try zofran  for next 24 hours. Return precautions given.  Nausea and vomiting in adult As above      Milda LITTIE Deed, MD [facility]

## 2023-12-06 NOTE — Patient Instructions (Addendum)
 It was good to see you today.   Please bring ALL of your medications with you to every visit.    Today we talked about: Diarrhea and vomiting- Likely norovirus, will try imodium  and zofran  for nausea. If you go without drinking for 48 hours, go to urgent care or ED for IV fluids.     Thank you for choosing [facility]. Please refer to your mychart for specifics regarding today's visit or future appointments.

## 2024-02-18 ENCOUNTER — Encounter: Payer: Self-pay | Admitting: Family Medicine

## 2024-02-18 ENCOUNTER — Ambulatory Visit: Payer: Self-pay | Admitting: Family Medicine

## 2024-02-18 ENCOUNTER — Ambulatory Visit: Admitting: Family Medicine

## 2024-02-18 VITALS — BP 125/78 | HR 105 | Temp 99.5°F | Ht 59.0 in | Wt 174.4 lb

## 2024-02-18 DIAGNOSIS — J029 Acute pharyngitis, unspecified: Secondary | ICD-10-CM | POA: Diagnosis not present

## 2024-02-18 LAB — POCT RAPID STREP A (OFFICE): Rapid Strep A Screen: NEGATIVE

## 2024-02-18 NOTE — Patient Instructions (Addendum)
 You most likely have a viral upper respiratory infection (aka a cold). Your body will naturally fight off this infection over the next 7-14 days. You may have a lingering cough for 6-8 weeks after the infection is gone, this is normal and helps to clear debris out of your lungs.  - I will reach out with the results of your strep swab. -Drink plenty of water and fluids to stay hydrated.  Your urine should be clear or light yellow. -Take Tylenol  or ibuprofen  as needed for fevers of the 100.3 Fahrenheit or higher -You can take 1 tablespoon of bees honey 4 times a day.  This can help soothe your throat.  Please seek further medical attention if you: - have trouble breathing - are vomiting uncontrollably - are unable to drink enough to stay hydrated - have fevers of 100.29F or higher for 3 days in a row

## 2024-02-18 NOTE — Progress Notes (Unsigned)
" ° ° °  SUBJECTIVE:   CHIEF COMPLAINT / HPI:   *** Discussed the use of AI scribe software for clinical note transcription with the patient, who gave verbal consent to proceed.  History of Present Illness Jill Rush is a 33 year old female who presents with a sore throat and cough.  Upper respiratory symptoms - Sore throat onset yesterday - Cough present since onset of sore throat - No new bumps observed in the oropharynx  Constitutional symptoms - Body aches and headaches since onset of illness - No fever, but recent check showed slightly elevated temperature  Medication use and side effects - Two doses of Nyquil taken, one early in the morning and another recently - Drowsiness possibly related to Nyquil use  Occupational factors - Works night shifts - Symptoms noticed upon waking after returning home from work     PERTINENT  PMH / PSH: ***  OBJECTIVE:   There were no vitals taken for this visit.  ***  ASSESSMENT/PLAN:   Assessment & Plan      Jill Nearing, MD [facility] "

## 2024-02-19 ENCOUNTER — Ambulatory Visit

## 2024-02-19 VITALS — BP 120/81 | HR 103 | Temp 98.8°F | Ht 60.0 in | Wt 172.4 lb

## 2024-02-19 DIAGNOSIS — J029 Acute pharyngitis, unspecified: Secondary | ICD-10-CM | POA: Diagnosis not present

## 2024-02-19 LAB — POC SOFIA 2 FLU + SARS ANTIGEN FIA
Influenza A, POC: NEGATIVE
Influenza B, POC: NEGATIVE
SARS Coronavirus 2 Ag: NEGATIVE

## 2024-02-19 NOTE — Progress Notes (Signed)
" ° ° °  SUBJECTIVE:   CHIEF COMPLAINT / HPI: Sore throat  Patient began feeling sick 2 days ago. It started at work with a cough that would not stop. Her throat feels dry despite drinking water. Throat is sore. She has a headache and ear pressure. She has been coughing up yellow/green phlegm. She threw up this morning. No fevers. No diarrhea. Not currently nauseous.   She started taking Nyquil but it made her too tired. She is now taking Theraflu every 4 hours, using cough drops, and throat spray.   PERTINENT  PMH / PSH: none pertinent   OBJECTIVE:   BP 120/81   Pulse (!) 103   Temp 98.8 F (37.1 C) (Oral)   Ht 5' (1.524 m)   Wt 172 lb 6.4 oz (78.2 kg)   SpO2 100%   BMI 33.67 kg/m   General: NAD HEENT: head atruamatric, normocephalic, Tms normal bilaterally, nasal passages erythematous and edematous with clear nasal discharge, throat erythematous and edematous, no exudates or petechiae Neck: no lymphadenopathy  Cardiovascular: tachycardic, no M/R/G Respiratory: CTAB, normal work of breathing on room air  Abdomen: soft, non-distended, non-tender to palpation   ASSESSMENT/PLAN:   Assessment & Plan Sore throat Step A swab yesterday negative. Flu and COVID swabs today negative. Most likely other viral process.  - Supportive care, rest/fluids, can continue Theraflu and cough drops/throat spray     Raguel KANDICE Lee, DO [facility] "

## 2024-02-19 NOTE — Patient Instructions (Addendum)
 I am sorry you are sick. Your Flu and COVID tests both came back negative. You most likely have some other viral illness. Treatment is rest and continue drinking plenty of fluids. You can keep taking Theraflu and using cough drops/throat spray. You can also try hot tea with honey to help sooth your throat. Most often day 3-4 of a virus is the worst, but it will get better with time.
